# Patient Record
Sex: Male | Born: 1965 | Race: Black or African American | Hispanic: No | Smoking: Never smoker
Health system: Southern US, Community
[De-identification: ages and names within clinical notes are randomized; demographics above are authoritative.]

## PROBLEM LIST (undated history)

## (undated) ENCOUNTER — Encounter

## (undated) ENCOUNTER — Telehealth

## (undated) ENCOUNTER — Ambulatory Visit

## (undated) ENCOUNTER — Encounter: Attending: Gastroenterology | Primary: Gastroenterology

## (undated) ENCOUNTER — Ambulatory Visit: Payer: PRIVATE HEALTH INSURANCE | Attending: Gastroenterology | Primary: Gastroenterology

## (undated) ENCOUNTER — Ambulatory Visit: Payer: PRIVATE HEALTH INSURANCE

## (undated) ENCOUNTER — Telehealth: Attending: Gastroenterology | Primary: Gastroenterology

## (undated) ENCOUNTER — Encounter: Attending: Pharmacist | Primary: Pharmacist

## (undated) DIAGNOSIS — M109 Gout, unspecified: Secondary | ICD-10-CM

## (undated) DIAGNOSIS — K7460 Unspecified cirrhosis of liver: Secondary | ICD-10-CM

## (undated) DIAGNOSIS — I10 Essential (primary) hypertension: Secondary | ICD-10-CM

## (undated) DIAGNOSIS — K8309 Other cholangitis: Secondary | ICD-10-CM

## (undated) HISTORY — DX: Gout, unspecified: M10.9

## (undated) HISTORY — DX: Unspecified cirrhosis of liver: K74.60

## (undated) HISTORY — DX: Other cholangitis: K83.09

## (undated) HISTORY — DX: Essential (primary) hypertension: I10

## (undated) HISTORY — PX: TOTAL COLECTOMY: SHX852

---

## 1998-11-07 DIAGNOSIS — K7460 Unspecified cirrhosis of liver: Secondary | ICD-10-CM

## 1998-11-07 HISTORY — DX: Unspecified cirrhosis of liver: K74.60

## 1998-12-18 ENCOUNTER — Ambulatory Visit (HOSPITAL_COMMUNITY): Admission: RE | Admit: 1998-12-18 | Discharge: 1998-12-18 | Payer: Self-pay | Admitting: Gastroenterology

## 1999-12-22 ENCOUNTER — Encounter: Payer: Self-pay | Admitting: Emergency Medicine

## 1999-12-22 ENCOUNTER — Emergency Department (HOSPITAL_COMMUNITY): Admission: EM | Admit: 1999-12-22 | Discharge: 1999-12-22 | Payer: Self-pay | Admitting: Emergency Medicine

## 1999-12-23 ENCOUNTER — Ambulatory Visit (HOSPITAL_COMMUNITY): Admission: RE | Admit: 1999-12-23 | Discharge: 1999-12-23 | Payer: Self-pay | Admitting: Gastroenterology

## 1999-12-23 ENCOUNTER — Encounter: Payer: Self-pay | Admitting: Gastroenterology

## 1999-12-30 ENCOUNTER — Encounter: Payer: Self-pay | Admitting: Gastroenterology

## 1999-12-30 ENCOUNTER — Ambulatory Visit (HOSPITAL_COMMUNITY): Admission: RE | Admit: 1999-12-30 | Discharge: 1999-12-30 | Payer: Self-pay | Admitting: Gastroenterology

## 2000-01-01 ENCOUNTER — Encounter (INDEPENDENT_AMBULATORY_CARE_PROVIDER_SITE_OTHER): Payer: Self-pay | Admitting: Specialist

## 2000-01-01 ENCOUNTER — Encounter: Payer: Self-pay | Admitting: General Surgery

## 2000-01-02 ENCOUNTER — Inpatient Hospital Stay (HOSPITAL_COMMUNITY): Admission: AD | Admit: 2000-01-02 | Discharge: 2000-01-11 | Payer: Self-pay | Admitting: General Surgery

## 2000-01-12 ENCOUNTER — Inpatient Hospital Stay (HOSPITAL_COMMUNITY): Admission: EM | Admit: 2000-01-12 | Discharge: 2000-01-19 | Payer: Self-pay | Admitting: *Deleted

## 2000-01-12 ENCOUNTER — Encounter: Payer: Self-pay | Admitting: *Deleted

## 2000-01-17 ENCOUNTER — Encounter: Payer: Self-pay | Admitting: Surgery

## 2000-01-31 ENCOUNTER — Encounter: Payer: Self-pay | Admitting: General Surgery

## 2000-01-31 ENCOUNTER — Emergency Department (HOSPITAL_COMMUNITY): Admission: EM | Admit: 2000-01-31 | Discharge: 2000-01-31 | Payer: Self-pay | Admitting: Emergency Medicine

## 2000-02-14 ENCOUNTER — Encounter: Admission: RE | Admit: 2000-02-14 | Discharge: 2000-02-14 | Payer: Self-pay | Admitting: Gastroenterology

## 2000-02-14 ENCOUNTER — Encounter: Payer: Self-pay | Admitting: Gastroenterology

## 2000-09-11 ENCOUNTER — Encounter (INDEPENDENT_AMBULATORY_CARE_PROVIDER_SITE_OTHER): Payer: Self-pay

## 2000-09-11 ENCOUNTER — Ambulatory Visit (HOSPITAL_COMMUNITY): Admission: RE | Admit: 2000-09-11 | Discharge: 2000-09-11 | Payer: Self-pay | Admitting: Gastroenterology

## 2002-03-25 ENCOUNTER — Ambulatory Visit (HOSPITAL_COMMUNITY): Admission: RE | Admit: 2002-03-25 | Discharge: 2002-03-25 | Payer: Self-pay | Admitting: Gastroenterology

## 2002-03-25 ENCOUNTER — Encounter (INDEPENDENT_AMBULATORY_CARE_PROVIDER_SITE_OTHER): Payer: Self-pay | Admitting: *Deleted

## 2004-11-07 HISTORY — PX: LIVER TRANSPLANT: SHX410

## 2005-09-22 ENCOUNTER — Encounter: Admission: RE | Admit: 2005-09-22 | Discharge: 2005-09-22 | Payer: Self-pay | Admitting: Neurology

## 2007-12-04 ENCOUNTER — Ambulatory Visit: Payer: Self-pay | Admitting: Family Medicine

## 2007-12-04 ENCOUNTER — Ambulatory Visit (HOSPITAL_COMMUNITY): Admission: RE | Admit: 2007-12-04 | Discharge: 2007-12-04 | Payer: Self-pay | Admitting: Family Medicine

## 2007-12-04 DIAGNOSIS — I10 Essential (primary) hypertension: Secondary | ICD-10-CM | POA: Insufficient documentation

## 2007-12-04 DIAGNOSIS — Z944 Liver transplant status: Secondary | ICD-10-CM | POA: Insufficient documentation

## 2007-12-04 DIAGNOSIS — K5190 Ulcerative colitis, unspecified, without complications: Secondary | ICD-10-CM | POA: Insufficient documentation

## 2007-12-12 ENCOUNTER — Ambulatory Visit: Payer: Self-pay | Admitting: Family Medicine

## 2007-12-12 ENCOUNTER — Encounter: Payer: Self-pay | Admitting: Family Medicine

## 2007-12-12 LAB — CONVERTED CEMR LAB: Microalbumin U total vol: NEGATIVE mg/L

## 2007-12-13 ENCOUNTER — Encounter: Payer: Self-pay | Admitting: Family Medicine

## 2007-12-13 LAB — CONVERTED CEMR LAB
BUN: 16 mg/dL (ref 6–23)
CO2: 28 meq/L (ref 19–32)
Calcium: 9.6 mg/dL (ref 8.4–10.5)
Chloride: 100 meq/L (ref 96–112)
Cholesterol: 140 mg/dL (ref 0–200)
Creatinine, Ser: 1.08 mg/dL (ref 0.40–1.50)
Glucose, Bld: 118 mg/dL — ABNORMAL HIGH (ref 70–99)
HDL: 34 mg/dL — ABNORMAL LOW (ref >39)
LDL Cholesterol: 78 mg/dL (ref 0–99)
Potassium: 3.7 meq/L (ref 3.5–5.3)
Sodium: 139 meq/L (ref 135–145)
Total CHOL/HDL Ratio: 4.1
Triglycerides: 139 mg/dL (ref <150)
VLDL: 28 mg/dL (ref 0–40)

## 2008-02-07 ENCOUNTER — Ambulatory Visit: Payer: Self-pay | Admitting: Family Medicine

## 2008-02-07 DIAGNOSIS — E669 Obesity, unspecified: Secondary | ICD-10-CM | POA: Insufficient documentation

## 2008-02-21 ENCOUNTER — Encounter: Payer: Self-pay | Admitting: Family Medicine

## 2008-02-21 ENCOUNTER — Ambulatory Visit: Payer: Self-pay | Admitting: Family Medicine

## 2008-02-28 LAB — CONVERTED CEMR LAB: Glucose, Bld: 99 mg/dL (ref 70–99)

## 2008-04-16 ENCOUNTER — Ambulatory Visit: Payer: Self-pay | Admitting: Family Medicine

## 2008-04-16 DIAGNOSIS — R7309 Other abnormal glucose: Secondary | ICD-10-CM

## 2008-06-09 ENCOUNTER — Ambulatory Visit: Payer: Self-pay | Admitting: Sports Medicine

## 2008-06-10 ENCOUNTER — Ambulatory Visit: Payer: Self-pay | Admitting: Family Medicine

## 2008-06-10 ENCOUNTER — Encounter: Payer: Self-pay | Admitting: Family Medicine

## 2008-06-10 LAB — CONVERTED CEMR LAB
TSH: 1.632 microintl units/mL (ref 0.350–4.50)
Testosterone: 532.74 ng/dL (ref 350–890)

## 2008-06-11 ENCOUNTER — Encounter: Payer: Self-pay | Admitting: Family Medicine

## 2008-07-08 ENCOUNTER — Encounter: Payer: Self-pay | Admitting: Family Medicine

## 2008-08-19 ENCOUNTER — Telehealth: Payer: Self-pay | Admitting: *Deleted

## 2008-11-25 ENCOUNTER — Encounter: Payer: Self-pay | Admitting: Family Medicine

## 2009-01-12 ENCOUNTER — Ambulatory Visit: Payer: Self-pay | Admitting: Family Medicine

## 2009-01-27 ENCOUNTER — Ambulatory Visit: Payer: Self-pay | Admitting: Family Medicine

## 2009-01-27 ENCOUNTER — Encounter (INDEPENDENT_AMBULATORY_CARE_PROVIDER_SITE_OTHER): Payer: Self-pay | Admitting: *Deleted

## 2009-06-24 ENCOUNTER — Telehealth: Payer: Self-pay | Admitting: *Deleted

## 2009-12-08 ENCOUNTER — Encounter: Payer: Self-pay | Admitting: Family Medicine

## 2010-01-25 ENCOUNTER — Encounter: Payer: Self-pay | Admitting: Family Medicine

## 2010-05-14 ENCOUNTER — Encounter: Payer: Self-pay | Admitting: Family Medicine

## 2010-05-14 ENCOUNTER — Ambulatory Visit: Payer: Self-pay | Admitting: Family Medicine

## 2010-05-14 LAB — CONVERTED CEMR LAB
BUN: 16 mg/dL (ref 6–23)
CO2: 29 meq/L (ref 19–32)
Calcium: 9.3 mg/dL (ref 8.4–10.5)
Chloride: 99 meq/L (ref 96–112)
Cholesterol: 161 mg/dL (ref 0–200)
Creatinine, Ser: 1.02 mg/dL (ref 0.40–1.50)
Glucose, Bld: 117 mg/dL — ABNORMAL HIGH (ref 70–99)
HDL: 37 mg/dL — ABNORMAL LOW (ref >39)
LDL Cholesterol: 93 mg/dL (ref 0–99)
Potassium: 3.6 meq/L (ref 3.5–5.3)
Sodium: 140 meq/L (ref 135–145)
Total CHOL/HDL Ratio: 4.4
Triglycerides: 153 mg/dL — ABNORMAL HIGH (ref <150)
VLDL: 31 mg/dL (ref 0–40)

## 2010-05-17 ENCOUNTER — Encounter: Payer: Self-pay | Admitting: Family Medicine

## 2010-08-06 ENCOUNTER — Telehealth: Payer: Self-pay | Admitting: *Deleted

## 2010-08-24 ENCOUNTER — Ambulatory Visit: Payer: Self-pay | Admitting: Family Medicine

## 2010-08-24 DIAGNOSIS — J3489 Other specified disorders of nose and nasal sinuses: Secondary | ICD-10-CM | POA: Insufficient documentation

## 2010-10-21 ENCOUNTER — Ambulatory Visit: Payer: Self-pay | Admitting: Family Medicine

## 2010-11-07 DIAGNOSIS — M109 Gout, unspecified: Secondary | ICD-10-CM

## 2010-11-07 HISTORY — DX: Gout, unspecified: M10.9

## 2010-12-09 NOTE — Assessment & Plan Note (Signed)
Summary: bp ck,tcb   Vital Signs:  Patient profile:   45 year old male Height:      73.5 inches Weight:      269 pounds BMI:     35.14 BSA:     2.45 Temp:     98.0 degrees F Pulse rate:   57 / minute BP sitting:   134 / 91  Vitals Entered By: Jone Baseman CMA (May 14, 2010 8:56 AM) CC: BP check Is Patient Diabetic? No Pain Assessment Patient in pain? no        Primary Care Provider:  Myrtie Soman, MD  CC:  BP check.  History of Present Illness: 1. HTN:  Pt is taking and tolerating his medicines as prescribed.  He does check his blood pressure at home regularly.  He is normally not having any problems from his blood pressure.  However, 2 weeks ago he was dizzy so he checked his blood pressure and it was around 140/104 and the next day it was 134/101 and then the next day it normalized and was 130/83.      ROS: denies chest pain, shortness of breath, headache  2. Right shoulder pain:  Been there for 3-4 weeks.  Worse when he is lifting weights (primarily shoulder press and bench press).  Overall it is getting better.  Rest makes it better.  He hasn't been taking anything for it.  3. Fam Hx of DM:  He has never been checked for DM.  His mom and his sister have both been diagnosed with it.      ROS: denies polyuria, polydipsia, fatigue, vision changes  4. Obesity:  He likes to lift weight 3-4 times/ week.  He doesn't watch what he eats.  He has been gaining weight.  He is interested in losing some weight.  5. Ulcerative Colitis:  He is taking his medicines as prescribed.    Habits & Providers  Alcohol-Tobacco-Diet     Tobacco Status: never  Current Medications (verified): 1)  Prograf 1 Mg  Caps (Tacrolimus) .... 7 Caps By Mouth Tid 2)  Cellcept 500 Mg  Tabs (Mycophenolate Mofetil) .... 2 Tabs By Mouth Bid 3)  Asacol 400 Mg  Tbec (Mesalamine) .... 4 Tabs By Mouth Bid 4)  Ursodiol 300 Mg  Caps (Ursodiol) .... One By Mouth Tid 5)  Hydrochlorothiazide 25 Mg  Tabs  (Hydrochlorothiazide) .... One By Mouth Daily 6)  Metoprolol Tartrate 50 Mg Tabs (Metoprolol Tartrate) .... One By Mouth Bid 7)  Amlodipine Besylate 10 Mg Tabs (Amlodipine Besylate) .... One By Mouth Daily  Past History:  Past Medical History: Reviewed history from 02/09/2010 and no changes required. 1. NAFLD - dx'd in 1996, s/p Liver transplant in 2000 -- managed yearly [facility] (Dr. Jacqualine Mau) 2. open cholecystectomy in 2000 with mesh repair later for high hernia at surgical site 3. Ulcerative colitis -- managed by Dr. Matthias Hughs - colonoscopy 1/10 showed remission - follow-up in one year.  4. h/o colon polyps 5. meniscus repair R knee 1996 6. reports h/o of colon polyps - followed by Dr. Matthias Hughs     *colonoscpy 3/11 with removal of 2 sessile polyps; pathology negative for malignancy -- has follow-up colonoscpy recommended in one year.   Social History: Reviewed history from 06/09/2008 and no changes required. Divorced 2 years ago, remarried to [place] Landrum-Moe 9/08. Lives with wife and 3 children. Problems settling on house with ex-wife. Never smoked, etoh only in college. Works as Production designer, theatre/television/film for [employer].  Bachelor's from [employer] in sports management. 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  Physical Exam  General:  Vitals reviewed.  alert, well-hydrated, and overweight-appearing.   Eyes:  EOMI. Perrla. Funduscopic exam benign, without hemorrhages, exudates or papilledema. Vision grossly normal. Mouth:  Oral mucosa and oropharynx without lesions or exudates.  Teeth in good repair. Neck:  No deformities, masses, or tenderness noted. Lungs:  Normal respiratory effort, chest expands symmetrically. Lungs are clear to auscultation, no crackles or wheezes. Heart:  Normal rate and regular rhythm. S1 and S2 normal without gallop, murmur, click, rub or other extra sounds. Abdomen:  surgical scars stable, well-healed.  Msk:  R shoulder:  No deformity, swelling, or redness.  Normal ROM.  5/5  strength. Extremities:  2+ radial pulses; extremeties warm and well-perfused; no edema. Psych:  Oriented X3, normally interactive, and good eye contact.     Impression & Recommendations:  Problem # 1:  ESSENTIAL HYPERTENSION, BENIGN (ICD-401.1) Assessment Unchanged BP acceptable today.  Plan discussed with pt will be to have him monitor his BP over the next couple of months and if it remains elevated we will make some medication adjustments.  Check BMET today. The following medications were removed from the medication list:    Hydrochlorothiazide 25 Mg Tabs (Hydrochlorothiazide) ..... One tablet by mouth daily His updated medication list for this problem includes:    Hydrochlorothiazide 25 Mg Tabs (Hydrochlorothiazide) ..... One by mouth daily    Metoprolol Tartrate 50 Mg Tabs (Metoprolol tartrate) ..... One by mouth bid    Amlodipine Besylate 10 Mg Tabs (Amlodipine besylate) ..... One by mouth daily  Orders: T-Basic Metabolic Panel [phone]) [facility]- Est  Level 4 (10272)  Problem # 2:  SHOULDER PAIN, RIGHT (ICD-719.41) Assessment: New  Likely muscle strain from lifting.  Getting better.  Tylenol / Ibuprofen as needed.  Orders: [facility]- Est  Level 4 (53664)  Problem # 3:  PREDIABETES (ICD-790.29) Assessment: Unchanged  Recheck fasting blood sugar today  Orders: [facility]- Est  Level 4 (40347)  Problem # 4:  OBESITY (ICD-278.00) Assessment: New  Encouraged him to decrease his rest time between sets when he is lifting.  Will discuss diet with him more at next visit.  Check FLP today.  Orders: [facility]- Est  Level 4 (99214)  Complete Medication List: 1)  Prograf 1 Mg Caps (Tacrolimus) .... 7 caps by mouth tid 2)  Cellcept 500 Mg Tabs (Mycophenolate mofetil) .... 2 tabs by mouth bid 3)  Asacol 400 Mg Tbec (Mesalamine) .... 4 tabs by mouth bid 4)  Ursodiol 300 Mg Caps (Ursodiol) .... One by mouth tid 5)  Hydrochlorothiazide 25 Mg Tabs (Hydrochlorothiazide) .... One by mouth daily 6)   Metoprolol Tartrate 50 Mg Tabs (Metoprolol tartrate) .... One by mouth bid 7)  Amlodipine Besylate 10 Mg Tabs (Amlodipine besylate) .... One by mouth daily  Other Orders: T-Lipid Profile [phone])  Patient Instructions: 1)  It was a pleasure meeting you today 2)  I am going to check some blood word today including your cholesterol and to check you for diabetes.  I will let you know of those results 3)  For your blood pressure I would like for you to keep track of it for the next couple of months.  If it remains elevated please let me know 4)  For your workouts, try and decrease your rest time between sets that way you can get some cardio in with your weight training 5)  Please schedule a follow up appointment with me in  2-3 months  Prevention & Chronic Care Immunizations   Influenza vaccine: Not documented    Tetanus booster: Not documented    Pneumococcal vaccine: Not documented  Other Screening   Smoking status: never  (05/14/2010)  Lipids   Total Cholesterol: 140  (12/12/2007)   Lipid panel action/deferral: Lipid Panel ordered   LDL: 78  (12/12/2007)   LDL Direct: Not documented   HDL: 34  (12/12/2007)   Triglycerides: 139  (12/12/2007)  Hypertension   Last Blood Pressure: 134 / 91  (05/14/2010)   Serum creatinine: 1.08  (12/12/2007)   BMP action: Ordered   Serum potassium 3.7  (12/12/2007)    Hypertension flowsheet reviewed?: Yes   Progress toward BP goal: Unchanged  Self-Management Support :   Personal Goals (by the next clinic visit) :      Personal blood pressure goal: 140/90  (05/14/2010)   Hypertension self-management support: Not documented

## 2010-12-09 NOTE — Letter (Signed)
Summary: Generic Letter  [facility]  [address]   [place], [place] [address]   Phone: [phone]  Fax: [phone]    05/17/2010  [facility] [address] [place], [place]  [address]  Dear Mr. Plaskett,  Here is a copy of your lab results.  These results indicate that you do not have diabetes and your cholesterol is fine.  Tests: (1) Basic Metabolic Panel (57846)   Order Note: FASTING   Sodium                    140 mEq/L                   135-145   Potassium                 3.6 mEq/L                   3.5-5.3   Chloride                  99 mEq/L                    96-112   CO2                       29 mEq/L                    19-32   Glucose              [H]  117 mg/dL                   96-29   BUN                       16 mg/dL                    5-28   Creatinine                1.02 mg/dL                  0.40-1.50   Calcium                   9.3 mg/dL                   4.1-32.4  Tests: (2) Lipid Profile (40102)   Cholesterol               161 mg/dL                   7-253     ATP III Classification:           < 200        mg/dL        Desirable          200 - 239     mg/dL        Borderline High          >= 240        mg/dL        High         Triglyceride         [H]  153 mg/dL                   <664   HDL Cholesterol      [  L]  37 mg/dL                    >16   Total Chol/HDL Ratio      4.4 Ratio  VLDL Cholesterol (Calc)                             31 mg/dL                    1-09  LDL Cholesterol (Calc)                             93 mg/dL                    6-04           Total Cholesterol/HDL Ratio:CHD Risk                            Coronary Heart Disease Risk Table                                            Men       Women              1/2 Average Risk              3.4        3.3                  Average Risk              5.0        4.4              2 X Average Risk              9.6        7.1              3 X Average Risk              23.4       11.0     Use the calculated Patient Ratio above and the CHD Risk table      to determine the patient's CHD Risk.     ATP III Classification (LDL):           < 100        mg/dL         Optimal          100 - 129     mg/dL         Near or Above Optimal          130 - 159     mg/dL         Borderline High          160 - 189     mg/dL         High           > 190        mg/dL         Very High  Sincerely,   Angelena Sole MD  Appended Document: Generic Letter mailed

## 2010-12-09 NOTE — Consult Note (Signed)
Summary: [facility]   Imported By: Clydell Hakim 12/21/2009 16:15:38  _____________________________________________________________________  External Attachment:    Type:   Image     Comment:   External Document

## 2010-12-09 NOTE — Assessment & Plan Note (Signed)
Summary: F/U  BP/KH   Vital Signs:  Patient profile:   45 year old male Weight:      272.3 pounds Pulse rate:   60 / minute BP sitting:   135 / 90  (right arm) Cuff size:   large  Vitals Entered By: Arlyss Repress CMA, (October 21, 2010 2:55 PM) CC: f/up HTN and change of meds Is Patient Diabetic? No Pain Assessment Patient in pain? no        Primary Care Michalina Calbert:  Myrtie Soman, MD  CC:  f/up HTN and change of meds.  History of Present Illness: 1. HTN:  Pt is here to follow up on his blood pressure.  He is taking his medicines as prescribed.  He does check his blood pressure at home.  It is averaging around 135/90.  He was having some light-headedness for a couple weeks after we made the change but for the past 3 weeks he hasn't had that any more.  ROS:  Denies chest pain, shortness of breath   **Pt would like to schedule a full physical with blood work for next visit**  Habits & Providers  Alcohol-Tobacco-Diet     Tobacco Status: never  Current Medications (verified): 1)  Prograf 1 Mg  Caps (Tacrolimus) .... 7 Caps By Mouth Tid 2)  Cellcept 500 Mg  Tabs (Mycophenolate Mofetil) .... 2 Tabs By Mouth Bid 3)  Asacol 400 Mg  Tbec (Mesalamine) .... 4 Tabs By Mouth Bid 4)  Ursodiol 300 Mg  Caps (Ursodiol) .... One By Mouth Tid 5)  Hydrochlorothiazide 50 Mg Tabs (Hydrochlorothiazide) .Marland Kitchen.. 1 Tab By Mouth Daily 6)  Metoprolol Tartrate 50 Mg Tabs (Metoprolol Tartrate) .... One By Mouth Bid 7)  Amlodipine Besylate 10 Mg Tabs (Amlodipine Besylate) .... One By Mouth Daily 8)  Fluticasone Propionate 50 Mcg/act Susp (Fluticasone Propionate) .Marland Kitchen.. 1 Spray Into Right Nostril Twice A Day  Social History: Reviewed history from 05/14/2010 and no changes required. Divorced 2 years ago, remarried to [place] Landrum-Thibeau 9/08. Lives with wife and 3 children.  Works as Production designer, theatre/television/film for [employer]. Bachelor's from [employer] in sports management. 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  Physical Exam  General:  Vitals reviewed.  alert, well-hydrated, and overweight-appearing.   Eyes:  EOMI. Perrla. Funduscopic exam benign Neck:  no masses.   Lungs:  Normal respiratory effort, chest expands symmetrically. Lungs are clear to auscultation, no crackles or wheezes. Heart:  Normal rate and regular rhythm. S1 and S2 normal without gallop, murmur, click, rub or other extra sounds. Extremities:  2+ radial pulses; extremeties warm and well-perfused; no edema.   Impression & Recommendations:  Problem # 1:  ESSENTIAL HYPERTENSION, BENIGN (ICD-401.1) Assessment Improved  Doing well.  At goal.  Continue current medications. His updated medication list for this problem includes:    Hydrochlorothiazide 50 Mg Tabs (Hydrochlorothiazide) .Marland Kitchen... 1 tab by mouth daily    Metoprolol Tartrate 50 Mg Tabs (Metoprolol tartrate) ..... One by mouth bid    Amlodipine Besylate 10 Mg Tabs (Amlodipine besylate) ..... One by mouth daily  Orders: [facility]- Est Level  3 ([address])  Complete Medication List: 1)  Prograf 1 Mg Caps (Tacrolimus) .... 7 caps by mouth tid 2)  Cellcept 500 Mg Tabs (Mycophenolate mofetil) .... 2 tabs by mouth bid 3)  Asacol 400 Mg Tbec (Mesalamine) .... 4 tabs by mouth bid 4)  Ursodiol 300 Mg Caps (Ursodiol) .... One by mouth tid 5)  Hydrochlorothiazide 50 Mg Tabs (Hydrochlorothiazide) .Marland Kitchen.. 1 tab by mouth  daily 6)  Metoprolol Tartrate 50 Mg Tabs (Metoprolol tartrate) .... One by mouth bid 7)  Amlodipine Besylate 10 Mg Tabs (Amlodipine besylate) .... One by mouth daily 8)  Fluticasone Propionate 50 Mcg/act Susp (Fluticasone propionate) .Marland Kitchen.. 1 spray into right nostril twice a day  Patient Instructions: 1)  I am pleased with your blood pressure 2)  We will keep you with your same medicines for now 3)  Please schedule an appointment in 3 months and we'll do a full physical then Prescriptions: AMLODIPINE BESYLATE 10 MG TABS (AMLODIPINE BESYLATE) one by mouth daily  #90 x 6    Entered and Authorized by:   Angelena Sole MD   Signed by:   Angelena Sole MD on 10/21/2010   Method used:   Electronically to        [facility]  [phone]* (retail)       [address]       [place], [place]  [address]       Ph: [phone] or [phone]       Fax: [phone]   RxID:   [id number]    Orders Added: 1)  [facility]- Est Level  3 [[address]]

## 2010-12-09 NOTE — Assessment & Plan Note (Signed)
Summary: bp recheck/bmc   Vital Signs:  Patient profile:   45 year old male Height:      73.5 inches Weight:      272.5 pounds BMI:     35.59 Temp:     97.8 degrees F oral Pulse rate:   69 / minute BP sitting:   147 / 85  (left arm) Cuff size:   large  Vitals Entered By: Jimmy Footman, CMA (August 24, 2010 2:00 PM) CC: bp recheck, med reflls Is Patient Diabetic? No Pain Assessment Patient in pain? no        Primary Care Provider:  Myrtie Soman, MD  CC:  bp recheck and med reflls.  History of Present Illness: 1. HTN: Pt is taking and tolerating his medicines as prescribed.  He doesn't check his BP at home regularly.  When he does it is usually 145/85.    ROS: denies chest pain, shortness of breath  2. Sinus congestion:  He has had some difficulty breathing out of his right nostril for the past month.  He doesn't have any known seasonal allergies and hasn't had this problem before.  He can still move air okay but it seems more difficult than the left nostril.  ROS: denies fevers, sinus pain, cough, runny nose, itchy eyes  3. Liver transplant:  Only followed once a year by hepatologist.  His immune suppression drugs are working well.   Habits & Providers  Alcohol-Tobacco-Diet     Tobacco Status: never  Current Medications (verified): 1)  Prograf 1 Mg  Caps (Tacrolimus) .... 7 Caps By Mouth Tid 2)  Cellcept 500 Mg  Tabs (Mycophenolate Mofetil) .... 2 Tabs By Mouth Bid 3)  Asacol 400 Mg  Tbec (Mesalamine) .... 4 Tabs By Mouth Bid 4)  Ursodiol 300 Mg  Caps (Ursodiol) .... One By Mouth Tid 5)  Hydrochlorothiazide 50 Mg Tabs (Hydrochlorothiazide) .Marland Kitchen.. 1 Tab By Mouth Daily 6)  Metoprolol Tartrate 50 Mg Tabs (Metoprolol Tartrate) .... One By Mouth Bid 7)  Amlodipine Besylate 10 Mg Tabs (Amlodipine Besylate) .... One By Mouth Daily 8)  Fluticasone Propionate 50 Mcg/act Susp (Fluticasone Propionate) .Marland Kitchen.. 1 Spray Into Right Nostril Twice A Day  Past History:  Past Medical  History: Reviewed history from 02/09/2010 and no changes required. 1. NAFLD - dx'd in 1996, s/p Liver transplant in 2000 -- managed yearly at [facility] (Dr. Jacqualine Mau) 2. open cholecystectomy in 2000 with mesh repair later for high hernia at surgical site 3. Ulcerative colitis -- managed by Dr. Matthias Hughs - colonoscopy 1/10 showed remission - follow-up in one year.  4. h/o colon polyps 5. meniscus repair R knee 1996 6. reports h/o of colon polyps - followed by Dr. Matthias Hughs     *colonoscpy 3/11 with removal of 2 sessile polyps; pathology negative for malignancy -- has follow-up colonoscpy recommended in one year.   Physical Exam  General:  Vitals reviewed.  alert, well-hydrated, and overweight-appearing.   Nose:  Right nare:  Nasal airway is slightly red and swollen.  No polyps.  No nasal discharge Left nare: normal. Mouth:  Oral mucosa and oropharynx without lesions or exudates.  Teeth in good repair. Neck:  No deformities, masses, or tenderness noted. Lungs:  Normal respiratory effort, chest expands symmetrically. Lungs are clear to auscultation, no crackles or wheezes. Heart:  Normal rate and regular rhythm. S1 and S2 normal without gallop, murmur, click, rub or other extra sounds. Abdomen:  surgical scars stable, well-healed.   S/NT/ND Extremities:  2+ radial pulses;  extremeties warm and well-perfused; no edema.   Impression & Recommendations:  Problem # 1:  ESSENTIAL HYPERTENSION, BENIGN (ICD-401.1) Assessment Unchanged  Not at goal.  Will increase HCTZ to 50 mg daily.  F/U in 6-8 weeks to recheck. His updated medication list for this problem includes:    Hydrochlorothiazide 50 Mg Tabs (Hydrochlorothiazide) .Marland Kitchen... 1 tab by mouth daily    Metoprolol Tartrate 50 Mg Tabs (Metoprolol tartrate) ..... One by mouth bid    Amlodipine Besylate 10 Mg Tabs (Amlodipine besylate) ..... One by mouth daily  Orders: [facility]- Est  Level 4 (99214)  Problem # 2:  OTHER DISEASES OF NASAL CAVITY AND SINUSES  (ICD-478.19) Assessment: New  Right nasal passage is inflammed.  ? allergies.  Will try flonase to see if that helps.  Orders: [facility]- Est  Level 4 (99214)  Problem # 3:  LIVER REPLACED BY TRANSPLANT (ICD-V42.7) Assessment: Unchanged  Doing well.  Routine follow up.  Orders: [facility]- Est  Level 4 (99214)  Complete Medication List: 1)  Prograf 1 Mg Caps (Tacrolimus) .... 7 caps by mouth tid 2)  Cellcept 500 Mg Tabs (Mycophenolate mofetil) .... 2 tabs by mouth bid 3)  Asacol 400 Mg Tbec (Mesalamine) .... 4 tabs by mouth bid 4)  Ursodiol 300 Mg Caps (Ursodiol) .... One by mouth tid 5)  Hydrochlorothiazide 50 Mg Tabs (Hydrochlorothiazide) .Marland Kitchen.. 1 tab by mouth daily 6)  Metoprolol Tartrate 50 Mg Tabs (Metoprolol tartrate) .... One by mouth bid 7)  Amlodipine Besylate 10 Mg Tabs (Amlodipine besylate) .... One by mouth daily 8)  Fluticasone Propionate 50 Mcg/act Susp (Fluticasone propionate) .Marland Kitchen.. 1 spray into right nostril twice a day  Patient Instructions: 1)  It was good to see you again today 2)  We will increase the HCTZ to 50 mg daily 3)  I am also going to start Flonase for your sinus congestion 4)  Please schedule a follow up appointment in 6-8 weeks to recheck your blood pressure Prescriptions: HYDROCHLOROTHIAZIDE 50 MG TABS (HYDROCHLOROTHIAZIDE) 1 tab by mouth daily  #90 x 3   Entered and Authorized by:   Angelena Sole MD   Signed by:   Angelena Sole MD on 08/24/2010   Method used:   Electronically to        [facility]  [phone]* (retail)       [address]       [place], [place]  [address]       Ph: [phone] or [phone]       Fax: [phone]   RxID:   [id number] FLUTICASONE PROPIONATE 50 MCG/ACT SUSP (FLUTICASONE PROPIONATE) 1 spray into right nostril twice a day  #1 x 1   Entered and Authorized by:   Angelena Sole MD   Signed by:   Angelena Sole MD on 08/24/2010   Method used:   Electronically to        [facility]  [phone]* (retail)       [address]       [place], [place]  [address]       Ph: [phone] or [phone]       Fax: [phone]   RxID:   [id number] AMLODIPINE BESYLATE 10 MG TABS (AMLODIPINE BESYLATE) one by mouth daily  #90 x 3   Entered and Authorized by:   Angelena Sole MD   Signed by:   Angelena Sole MD on 08/24/2010  Method used:   Electronically to        [facility]  [phone]* (retail)       [address]       [place], [place]  [address]       Ph: [phone] or [phone]       Fax: [phone]   RxID:   [id number] METOPROLOL TARTRATE 50 MG TABS (METOPROLOL TARTRATE) one by mouth bid  #180 x 3   Entered and Authorized by:   Angelena Sole MD   Signed by:   Angelena Sole MD on 08/24/2010   Method used:   Electronically to        [facility]  [phone]* (retail)       [address]       [place], [place]  [address]       Ph: [phone] or [phone]       Fax: [phone]   RxID:   [id number]    Orders Added: 1)  [facility]- Est  Level 4 [[address]]    Prevention & Chronic Care Immunizations   Influenza vaccine: Not documented    Tetanus booster: Not documented    Pneumococcal vaccine: Not documented  Other Screening   Smoking status: never  (08/24/2010)  Lipids   Total Cholesterol: 161  (05/14/2010)   Lipid panel action/deferral: Lipid Panel ordered   LDL: 93  (05/14/2010)   LDL Direct: Not documented   HDL: 37  (05/14/2010)   Triglycerides: 153  (05/14/2010)  Hypertension   Last Blood Pressure: 147 / 85  (08/24/2010)   Serum creatinine: 1.02  (05/14/2010)   BMP action: Ordered   Serum potassium 3.6  (05/14/2010)    Hypertension flowsheet reviewed?: Yes   Progress toward BP goal: Unchanged  Self-Management Support :   Personal Goals (by the next clinic visit) :      Personal blood pressure goal: 140/90   (05/14/2010)   Hypertension self-management support: Not documented

## 2010-12-09 NOTE — Consult Note (Signed)
[facility]  [facility]   Imported By: Bradly Bienenstock 02/09/2010 09:29:19  _____________________________________________________________________  External Attachment:    Type:   Image     Comment:   External Document

## 2010-12-09 NOTE — Progress Notes (Signed)
Summary: Rx  Phone Note Refill Request Call back at [phone]   Refills Requested: Medication #1:  AMLODIPINE BESYLATE 10 MG TABS one by mouth daily.  Medication #2:  HYDROCHLOROTHIAZIDE 25 MG  TABS one by mouth daily Initial call taken by: Knox Royalty,  August 06, 2010 1:49 PM  Follow-up for Phone Call        Informed pt that refill was sent and that he would be given additional refills @ his next scheduled appt ............................................... Delora Fuel August 06, 2010 5:13 PM  Follow-up by: Jone Baseman CMA,  August 06, 2010 5:13 PM    Prescriptions: HYDROCHLOROTHIAZIDE 25 MG  TABS (HYDROCHLOROTHIAZIDE) one by mouth daily  #90 x 0   Entered by:   Jone Baseman CMA   Authorized by:   Angelena Sole MD   Signed by:   Jone Baseman CMA on 08/06/2010   Method used:   Electronically to        [facility]  [phone]* (retail)       [address]       [place], [place]  [address]       Ph: [phone] or [phone]       Fax: [phone]   RxID:   [id number] AMLODIPINE BESYLATE 10 MG TABS (AMLODIPINE BESYLATE) one by mouth daily  #90 x 0   Entered by:   Jone Baseman CMA   Authorized by:   Angelena Sole MD   Signed by:   Jone Baseman CMA on 08/06/2010   Method used:   Electronically to        [facility]  [phone]* (retail)       [address]       [place], [place]  [address]       Ph: [phone] or [phone]       Fax: [phone]   RxID:   [id number]

## 2011-03-25 NOTE — Discharge Summary (Signed)
[place]. [facility]  Patient:    Timothy Riggs, Timothy Riggs                       MRN: [id number] Adm. Date:  [id number] Disc. Date: [id number] Attending:  Osvaldo Human                           Discharge Summary  DISCHARGE DIAGNOSES:  1. Sclerosing cholangitis.  2. Acute cholangitis.  3. Postoperative wound infection.  OPERATION/PROCEDURE: None.  HISTORY OF PRESENT ILLNESS: This is a readmission for Mr. Timothy Riggs.  He is a 45 year old black male discharged just one day ago following open cholecystectomy on January 01, 2000.  The patient has a long-standing history of Crohns disease and sclerosing cholangitis, and presented with acute cholecystitis.  Open cholecystectomy was performed, and liver biopsy, and the clinical course postoperatively was consistent with smoldering acute cholangitis.  He was treated with antibiotics and had significantly improved. He also developed postoperative ascites that responded to diuretics.  The patient was feeling well at discharge, and then the morning of readmission developed progressive worsening of right upper quadrant abdominal pain and nausea and vomiting.  He presented to the [facility] Room. Currently at the time of his admission he is much more comfortable following pain medication.  PAST MEDICAL HISTORY: As above, otherwise negative.  MEDICATIONS:  1. Dipentum b.i.d.  2. Augmentin 500 mg b.i.d.  3. Lasix 20 mg q.d.  4. Aldactone 25 mg q.d.  5. K-Dur.  ALLERGIES: No known drug allergies.  PHYSICAL EXAMINATION:  VITAL SIGNS: Temperature was increased to 102 degrees.  Pulse was 130, respirations 20, and blood pressure 130/78.  GENERAL: He is a well-developed, well-nourished black male in no acute distress.  ABDOMEN: Pertinent findings were limited to the abdomen, which is mildly distended.  His incision appears to be healing but there is some fluid palpable beneath the lateral incision  without erythema.  There is mild tenderness in the right upper quadrant without guarding.  LABORATORY DATA: Laboratories on admission shows a WBC of 16,000 (was 10,000 yesterday at discharge), hemoglobin 10.5.  Bilirubin is increased to 8.3. Alkaline phosphatase is 584.  SGOT 212, SGPT 69.  CT scan of the abdomen was obtained and revealed a small to moderate fluid collection over the liver and in the porta hepatis, and in the pelvis, all consistent with ascites and normal postoperative changes.  There were questionably one or two low density areas in the periphery of the liver consistent with small abscesses.  HOSPITAL COURSE: The patient was admitted with right upper quadrant pain, fever, and elevated WBC, suspected to be low-grade acute cholangitis.  CT was really not felt to be consistent with a bile leak.  He was also felt to possibly have a wound infection.  The patient was admitted, blood cultures obtained, and IV antibiotics started.  He was immediately much more comfortable after admission.  His lateral wound was opened and drained, and his wound was treated with saline dressing changes.  The following day his WBC was decreased to 15,000, although his bilirubin was increased to 10.  Dr. Matthias Hughs followed the patient closely on a daily basis while hospitalized.  He did clinically improve and really had no recurrent of his abdominal pain.  On January 14, 2000 his WBC was decreased to 12,000 and bilirubin decreased to 9. He tolerated diet without difficulty.  He did end up growing a  gram-negative rod in his blood and was continued on IV Unasyn.  His wound was felt to likely be the source or possibly the biliary tree.  This subsequently proved to be a Klebsiella pneumoniae.  Clinically he improved and WBC on January 16, 2000 was decreased to 10,000 and bilirubin decreased to 6.  His CT was repeated, with plans to aspirate perihepatic fluid if still present; however, the CT showed marked  reduction in the fluid around the liver and less suspicion of small peripheral liver abscesses, and no tap was performed.  He was continued on IV antibiotics.  By January 18, 2000 his bilirubin was decreased to 5 and WBC was 10,000.  The wound cleaned up nicely with saline dressing changes.  He continued to steadily improve and was discharged on January 19, 2000.  DISCHARGE MEDICATIONS:  1. Augmentin p.o.  2. Continue the same medications as on admission.  FOLLOW-UP: Follow-up is to be with Dr. Matthias Hughs and myself in approximately one week. DD:  03/01/00 TD:  03/01/00 Job: [id number]

## 2011-03-25 NOTE — Op Note (Signed)
[facility]  Patient:    Timothy Riggs, Timothy Riggs                       MRN: [id number] Proc. Date: 09/11/00 Adm. Date:  [id number] Attending:  Rich Brave CC:         Brooke Dare, M.D., Div. of GI, [id number], [facility] , [place], [place] [address]                           Operative Report  PROCEDURE:  Colonoscopy with biopsy.  ENDOSCOPIST:  Florencia Reasons, M.D.  INDICATIONS:  A 45 year old male with longstanding ulcerative colitis and sclerosing cholangitis.  FINDINGS:  Minimal to mild colitis activity.  Multiple random mucosal biopsies obtained.  DESCRIPTION OF PROCEDURE:  The nature, purpose, and risks of the procedure were familiar to the patient from prior examinations, and he provided written consent.  Sedation was fentanyl 100 mcg and Versed 10 mg IV for this procedure and the upper endoscopy which had immediately preceded it.  Digital exam of the prostate was unremarkable.  The Olympus PCF140L pediatric extended length video colonoscope was easily advanced to the cecum, and pullback was then performed.  The quality of the prep was excellent, and it is felt that all areas were well seen.  There was some mild "sandpaper" erythema throughout essentially the entire length of the colon but without, for the most part, any significant disruption of the normal underlying vascular mucosal pattern nor any evidence of any granularity, exudate, or significant friability.  No polyps, masses, vascular malformations, or diverticular disease were observed.  Retroflexion was not performed in the rectum, but antegrade viewing disclosed no additional abnormalities.  Random mucosal biopsies were obtained roughly every 2 to 4 cm along the length of the colon, totalling approximately 30 biopsies in all.  The patient tolerated the procedure well, and there were no apparent complications.  IMPRESSION:  Minimal colitis activity.  PLAN:  Await results on  biopsies for dysplasia surveillance. DD:  09/11/00 TD:  09/11/00 Job: [id number]

## 2011-03-25 NOTE — Procedures (Signed)
[place]. [facility]  Patient:    Timothy, Riggs Visit Number: [id number] MRN: [id number]          Service Type: END Location: ENDO Attending Physician:  Rich Brave Dictated by:   Florencia Reasons, M.D. Proc. Date: 03/25/02 Admit Date:  03/25/2002   CC:         Dr. Brooke Dare, [id number], [facility] [address]   Procedure Report  PROCEDURE PERFORMED:  Upper endoscopy with esophageal banding.  ENDOSCOPIST:  Florencia Reasons, M.D.  INDICATIONS FOR PROCEDURE:  The patient is a 45 year old with sclerosing cholangitis and history of previous esophageal varices.  FINDINGS:  2 to 3+ varices, banded x 4.  DESCRIPTION OF PROCEDURE:  The nature, purpose and risks of the procedure had been discussed with the patient, who provided written consent.  Sedation was fentanyl 75 mcg and Versed 7 mg IV without arrhythmias or desaturation. The Olympus adult video endoscope was passed under direct vision entering the esophagus with mild difficulty, until the patient cooperated with swallowing.  The esophagus was pertinent for 2 to 3+ varices in two columns extending up to the midesophagus.  At the end of the procedure, the patient was re-endoscoped and these were banded with three bands on one varix and one band on the other. No reflux esophagitis, neoplasia or infection were evident.  The stomach contained a small bilious residual and had a little bit of proximal gastric erythema but no definite gastric varices.  No erosions, ulcers, polyps or masses were seen in the stomach and the pylorus, duodenal bulb and second duodenum looked normal.  No biopsies were obtained.  The patient tolerated the procedure well and there were no apparent complications.  IMPRESSION:  Esophageal varices, banded as described above.  PLAN:  Follow-up endoscopy in a couple of months to perform repeat banding if residual variceal tissue was present. Dictated by:    Florencia Reasons, M.D. Attending Physician:  Rich Brave DD:  03/25/02 TD:  03/27/02 Job: [id number]

## 2011-03-25 NOTE — Consult Note (Signed)
[place]. [facility]  Patient:    Timothy Riggs, Timothy Riggs                       MRN: [id number] Proc. Date: 01/06/00 Adm. Date:  [id number] Attending:  Delsa Bern CC:         Lorne Skeens. Hoxworth, M.D.                          Consultation Report  GASTROENTEROLOGY CONSULTATION  REASON FOR CONSULTATION:  Dr. Sharlet Salina T. Hoxworth asked me to see this 45 year old gentleman because of possible smoldering cholangitis.  HISTORY:  Timothy Riggs is about a week status post open cholecystectomy for acute cholecystitis.  Since his surgery, he has had smoldering fevers, leukocytosis and substantial elevation of liver chemistries beyond baseline, raising the question of cholangitis.  The patient, who has a long-standing history of ulcerative colitis, is also known to have sclerosing cholangitis.  An ERCP performed approximately four years ago, at which time multiple common duct stones were present, showed marked irregularity of the intrahepatic biliary tree.  The patient also had a couple of hospitalizations at [facility] several years ago because of fevers attributed to cholangitis, which responded to antibiotic therapy.  During this current operation, he underwent an intraoperative cholangiogram which showed attenuation of the common bile duct but no focal strictures, and there was adequate flow of contrast into the duodenum.  There was not much filling of the  intrahepatic biliary tree, so it is difficult to comment on the current status f his intrahepatic biliary strictures.  At this time, the patient has been switched from Rocephin to Unasyn as his antibiotic coverage and, for the past 24 to 36 hours, his fever curve has been normal.  He is free of any abdominal pain.  He is eating small amounts and is certainly in no acute distress.  Of further interest to the patients condition s the fact that a wedge liver biopsy obtained during the course of  his operation showed substantial hepatic fibrosis and pre-cirrhotic changes, per conservation  with the pathologist, Dr. Jimmy Picket.  He has also been noted to have some degree of abdominal distention, raising the  question of possible ascites accumulation.  There have been some mild loose stools since surgery but no frank ongoing diarrhea.  At surgery, the patient had an acutely inflamed gallbladder, consistent with acute cholecystitis, but more importantly, the liver was markedly enlarged and irregular in its contour.  PAST MEDICAL HISTORY:  Limited primarily to the ulcerative colitis, common duct  stones and history of sclerosing cholangitis.  FAMILY HISTORY:  Pertinent for the fact that his younger brother has ulcerative  colitis with sclerosing cholangitis also and, in fact, has been considered for referral to a liver transplant clinic.  SOCIAL HISTORY:  The patient is married but has no children.  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  REVIEW OF SYSTEMS:  Pertinent for a tendency for frequent loose bowel movements as an outpatient but without typically frank blood, significant abdominal pain or severe diarrhea.  PHYSICAL EXAMINATION:  GENERAL:  Timothy Riggs is a very stout, healthy-appearing African-American male in no evident distress.  HEENT:  Interestingly, his sclerae are not frankly icteric at this time.  ABDOMEN:  The abdomen is soft and without overt tenderness.  There is a large bandage over his laparoscopic incision (his surgery was started as a laparoscopic procedure) due to serous drainage  at that site.  He also has a JP drain, draining clear-amber fluid.  LABORATORY DATA:  White count has ranged from 24,000 at the time of admission to 20,000 today.  Hemoglobin is 9.8 with an MCV of 91.  Platelets have been moderately elevated in the 600,000 to 700,000 range and RDW is also high at 15.9.  The patients sodium has dropped from a baseline of 130  to a current level of 123 and his albumin, which was 1.7 on admission, is currently 1.4.  Total bilirubin in-house has climbed from 4.5 to 8.3 but it was actually a little bit higher yesterday than it is today.  During that same time, his alkaline phosphatase has dropped from a high of 347 to 298.  Transaminases have been mildly elevated, in the 100 range for SGOT and in the 50 range for SGPT.  X-RAY FINDINGS:  I reviewed the patients ERCP films from 1997 and his current intraoperative cholangiography, with findings as described above.  IMPRESSION: 1. Probable smoldering cholangitis characterized by fevers and elevated liver    chemistries. 2. 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 3. Probable mild ascites development. 4. Underlying ulcerative colitis.  DISCUSSION AND RECOMMENDATIONS: 1. With respect to the cholangitis, the patient has been afebrile while on Unasyn    and is free of any real pain. I suspect that his biliary tree may have been    seeded prior to surgery from gallbladder inflammation or infection or possibly    from the cholangiography performed at surgery.  In any event, if an infection    were present, it seems to be coming under control on the current therapy, even    though his white count remains somewhat elevated.  In reviewing the patients    previous endoscopic retrograde cholangiopancreatogram, I feel that the    irregularities in the intrahepatic biliary tree are so severe that it would e    difficult to treat any infection in the biliary tree endoscopically, such as by    stenting or balloon dilatation.  Any attempts to do so would probably be as    likely to introduce infection as to relieve it.  If it became necessary to    attempt such a maneuver, I would recommend referral to a biliary center of    excellence such as [facility]. The "easy" tests, such as draining the    common bile duct itself, appear to be  unnecessary, based on the finding on the    current cholangiogram, showing good drainage into the duodenum, despite the    generalized narrowing of the common bile duct.  2. With respect to the patients parenchymal liver disease, this could readily    account for his hyponatremia and his markedly low albumin level.  Per    conservation with Dr. Luisa Hart, it appears that the patient is currently grade 3    on a scale of 4 with the severity of his liver disease, and is, in fact,    pre-cirrhotic.  I therefore discussed with the patient the probable need for    elective evaluation at a liver transplant clinic.  Unfortunately, I am not    currently aware of any effective medical therapy for primary sclerosing    cholangitis but I will attempt to look into this.  Fortunately, he is not    showing signs of encephalopathy or other overt evidence of liver failure. 3. Concerning his apparent ascites, this is not too surprising, considering his  extensive hepatic fibrosis and his marked hypoalbuminemia.  I will request daily    weights to see how we are progressing here and if it is unclear, ultrasound ay    help clarify the picture.  The fluid would probably respond to diuretics, if it    progresses, but at the moment, I do not see a need for them. 4. Ulcerative colitis.  His underlying ulcerative colitis seems to be relatively    stable and quiescent at present.  He is having some loose stools which more    likely are arising from his antibiotic therapy and his post-cholecystectomy    status rather than to the ulcerative colitis itself.  I will restart mesalamine    (Asacol) but at the present time, I see no need for steroids.  I appreciate the opportunity to have seen this patient in consultation with you.DD:  01/06/00 TD:  01/07/00 Job: [id number]

## 2011-03-25 NOTE — Procedures (Signed)
[place]. [facility]  Patient:    DONNE, BALEY Visit Number: [id number] MRN: [id number]          Service Type: END Location: ENDO Attending Physician:  Rich Brave Dictated by:   Florencia Reasons, M.D. Proc. Date: 03/25/02 Admit Date:  03/25/2002   CC:         Dr. Brooke Dare, [id number], [facility] [address]   Procedure Report  PROCEDURE PERFORMED:  Colonoscopy with biopsies.  ENDOSCOPIST:  Florencia Reasons, M.D.  INDICATIONS FOR PROCEDURE:  The patient is a 46 year old with longstanding ulcerative colitis for dysplasia surveillance.  This last exam by me was approximately a year and a half ago at which time, no dysplasia was present and there was just minimally active chronic colitis at that time.  FINDINGS:  Low-grade "burned out" colitis with polypoid lesion in the ascending colon, biopsied and tattooed.  DESCRIPTION OF PROCEDURE:  The nature, purpose and risks of the procedure were familiar to the patient, who provided written consent.  Sedation for this procedure and the upper endoscopy which preceded it totaled fentanyl 75 mcg and Versed 8 mg IV without arrhythmias or desaturation.  Digital exam of the prostate was unremarkable. The Olympus colonoscope was advanced to the cecum as identified by the absence of further lumen and what appeared to be the ileocecal valve although the terminal ileum was not entered.  Pullback was then performed.  The quality of the prep was very good and it is felt that all areas were well seen.  In the midascending colon was a 1.5 cm verrucous somewhat irregularly marginated sessile lesion which I biopsied multiple times.  I elected not to attempt to snare it off, pending further delineation of its histological character.  No other polypoid lesions were observed in the colon and there was no evidence of any frank tumor or cancer, vascular malformations or diverticulosis but there was some  patchy erythema especially in the distal rectum and in the cecum.  No granularity, exudate or particularly active colitis appeared to be present. Note that the polypoid lesion was tattooed in three quadrants around it after it was biopsied.  Random mucosal biopsies were taken along the length of the colon and placed in three separate jars for the proximal colon, left colon and rectum.  Retroflexion was not performed in the rectum.  The patient tolerated the procedure well and there were no apparent complications.  IMPRESSION: 1. Polypoid lesion in the ascending colon of uncertain clinical significance,    pathology pending. 2. Low-grade burned out colitis, with random biopsies pending for dysplasia.  PLAN:  Await pathology results. Dictated by:   Florencia Reasons, M.D. Attending Physician:  Rich Brave DD:  03/25/02 TD:  03/27/02 Job: [id number]

## 2011-03-25 NOTE — Op Note (Signed)
[place]  Patient:    Timothy Riggs, Timothy Riggs                       MRN: [id number] Proc. Date: 01/01/00 Adm. Date:  [id number] Attending:  Delsa Bern CC:         Florencia Reasons, M.D.                           Operative Report  PREOPERATIVE DIAGNOSES: 1. Acute cholecystitis. 2. Sclerosing cholangitis.  POSTOPERATIVE DIAGNOSES: 1. Acute cholecystitis. 2. Sclerosing cholangitis.  SURGICAL PROCEDURE: 1. Laparoscopy. 2. Open cholecystectomy with intraoperative cholangiogram and wedge liver    biopsy.  SURGEON:  Lorne Skeens. Hoxworth, M.D.  ASSISTANTSheppard Plumber. Earlene Plater, M.D.  INDICATIONS:  Timothy Riggs is a 45 year old black male with a long history of Crohns disease and sclerosing cholangitis, followed by Dr. Katy Fitch. Buccini. e was feeling well as usual until about one week ago, when he developed persistent right upper quadrant and epigastric pain.  He has been noted to have an elevated white count, now up to 16,000, and his liver function tests have worsened with  bilirubin now up to 6.2, when he usually runs 2.  He has had a workup consisting of an ultrasound of the gallbladder which shows a thickened gallbladder wall, but o definite gallstones.  A HIDA scan was done yesterday which showed nonvisualization of the gallbladder, consistent with acute cholecystitis.  He does have a history of an ERCP and common bile duct stone with extraction several years ago.  He is felt to have acute cholecystitis and laparoscopic and possible open cholecystectomy ith cholangiogram has been recommended and accepted.  In addition, I have been requested to obtain a liver biopsy to follow his sclerosing cholangitis.  The nature of the procedure, its indications, and risks of bleeding, infection, bile leak, and bile duct injury were discussed and understood preoperatively.  He is now brought to the operating room for this  procedure.  DESCRIPTION OF PROCEDURE:  The patient was brought to the operating room and placed in the supine position on the operating room table.  General endotracheal anesthesia was induced.  PAS were in place.  He had been given broad spectrum antibiotics intravenously.  The abdomen was sterilely prepped and draped. Local anesthesia was used to infiltrate the skin just above the umbilicus and a 1.0 cm vertical incision was made, and dissection carried down to the midline fascia.  This was sharply incised for 1.0 cm.  The peritoneum was entered under direct vision.  The Hasson trocar was inserted through a mattress suture of #0 Vicryl.  Under direct vision a 10.0 mm trocar was placed in the subxiphoid area and a 5.0 mm trocar was placed in the lateral right abdomen.  The liver was seen to be markedly enlarged and irregular.  Video photographs were obtained.  The gallbladder was exposed and was markedly distended and edematous, consistent with acute cholecystitis.  The liver was extremely heavy and enlarged and displacing the gallbladder posteriorly and laterally.  I was able to get a grasper on the lateral fundus of the gallbladder, but there was no way to manipulate the liver whatsoever with the laparoscopic instruments, to allow even basic exposure of the infundibulum.  The gallbladder was also friable and I expect it would have torn  with any further traction.  For this reason, I elected to convert to  an open procedure.  The trocars were removed, and open instruments were counted and set up. A right subcostal incision was made and dissection was carried down through the  subcutaneous tissue, fascia, and muscle layers, using cautery, and the peritoneum entered directly.  The exposure was quite difficult due to the size and the immobility of the liver, and the infundibulum of the gallbladder was tucked under a large shelf of liver extending anterior to this.  A Thompson  retractor was used to retract the abdominal wall and carefully retract the liver, anterior of the gallbladder as much as possible.  The gallbladder was decompressed with a suction trocar, allowing better exposure.  The colon and duodenum were packed away and retracted.  Initially I divided the peritoneum along the infundibulum down toward the Calots triangle.  There was really, however, not enough mobility of the gallbladder to allow exposure of Calots triangle adequately beneath the markedly enlarged liver.  At this point I elected to take the gallbladder down retrograde. The peritoneum overlying the fundus was incised with cautery, and the gallbladder was taken down from its bed with electrocautery with moderate bleeding, controlled with cautery and a couple of clips.  The dissection was difficult due to difficulties with exposure as above, but we progressed steadily, and the gallbladder was freed progressively down to the infundibulum.  The cystic artery was identified, coursing up onto the gallbladder, and it was divided between two proximal and one distal clip.  Further careful dissection along the distal gallbladder and gallbladder-cystic duct junction eventually freed the gallbladder down to the cystic duct, which was exposed over about 1.0 cm.  At this point the operative cholangiograms were obtained with a ____ catheter through the cystic duct.  This showed a very small common bile duct, but no strictures, stones, or  obstruction, and free flow into the duodenum.  The common hepatic and intrahepatic ducts were severely involved with sclerosing cholangitis.  Following this the cholangiocath was removed.  The cystic duct was clamped and divided, and the specimen removed, and the cystic duct tied with a #2-0 Vicryl tie.  The gallbladder bed was inspected for hemostasis, which appeared complete.  We then proceeded with a liver biopsy.  The inferior edge of the anterior  right lobe of the liver was chosen and two #0 chromic sutures were placed in a V-fashion to control bleeding, and a wedge of liver was sharply excised, and the raw surface cauterized.  This was  sent for permanent section.  The abdomen was irrigated and inspected for hemostasis which was complete.  A 19-French round JP drain was placed in Morisons pouch and right upper quadrant, and brought out through a separate stab wound.  The wound was then closed in two layers with #0 and #1 running PDS.  The subcutaneous tissue as irrigated, and the skin closed with staples.  The sponge and needle counts were  correct.  The instrument count was incorrect, and a KUB is obtained which showed no instruments in the abdominal cavity.  The patient was taken to the recovery room in good condition, having tolerated he procedure well. DD:  01/01/00 TD:  01/02/00 Job: [id number]

## 2011-03-25 NOTE — Discharge Summary (Signed)
[place]  Patient:    Timothy Riggs, Timothy Riggs                       MRN: [id number] Adm. Date:  [id number] Disc. Date: [id number] Attending:  Osvaldo Human CC:         Florencia Reasons, M.D.                           Discharge Summary  DISCHARGE DIAGNOSES: 1. Acute cholecystitis. 2. Acute cholangitis. 3. Sclerosing cholangitis associated with Crohns disease.  OPERATION/PROCEDURES:  Open cholecystectomy with intraoperative cholangiogram and liver biopsy on January 01, 2000.  HISTORY OF PRESENT ILLNESS:  Timothy Riggs is a 45 year old black male with a long history of Crohns disease and sclerosing cholangitis, followed by Dr. Katy Fitch. Buccini.  About one week prior to this admission he developed persistent right upper quadrant abdominal epigastric pain.  He has been noted to have an elevated white count of 16,000, and worsening of his chronically mildly-elevated liver function tests, with bilirubin now up to 6.2, which usually runs about 2.  He had a workup consisting of an ultrasound of the gallbladder which was a thickened gallbladder wall, with no definite stones.  A HIDA scan has shown nonvisualization of the gallbladder, consistent with acute cholecystitis.  He has a history of an ERCP and common bile duct stone extraction several years ago.  The patient is felt to have acute cholecystitis, and is admitted semiurgently for a cholecystectomy.  PAST MEDICAL HISTORY:  Significant only as above.  CURRENT MEDICATIONS CHRONICALLY:  Dipentum two tablets b.i.d.  ALLERGIES:  No known drug allergies.  SOCIAL HISTORY/FAMILY HISTORY/REVIEW OF SYSTEMS:  See the detailed H&P.  PHYSICAL EXAMINATION:  VITAL SIGNS:  Temperature 98.9 degrees, pulse 86, respirations 18, blood pressure 130/80.  GENERAL:  He is a pleasant black male, who appears mildly ill.  HEENT:  Significant for scleral icterus.  ABDOMEN:  Revealed tenderness in the  right upper quadrant well-localized with guarding.  HOSPITAL COURSE:  On the day of admission he was taken to the operating room.  laparoscopy was performed with the findings of a significantly enlarged cirrhotic-appearing liver and acute cholecystitis.  The patient underwent open cholecystectomy with an intraoperative cholangiogram.  This was consistent with  significant sclerosing cholangitis with narrowed beaded intrahepatic ducts, and  some mild narrowing of the common bile duct but free flow into the duodenum. The patient was treated with broad-spectrum antibiotics perioperatively.  He has had a fever of 101 degrees on the first postoperative day.  At this time the white count was increased to 24,000, and bilirubin was decreased to 4.5.  He was continued n IV antibiotics and a clear liquid diet started.  He seemed significantly improved on the second postoperative day with the temperature down to 99.5 degrees, and ess pain and a benign abdomen.  On the third postoperative day he was noted to have had some nausea and vomiting the previous night, but felt better.  The abdomen showed some mild distention.  The white count was decreased to 20,000; however, his bilirubin was increased to 5.2, alkaline phosphatase was 275, SGOT and SGPT 87 nd 37.  He was felt at this time to have an ileus.  Hyponatremia was also noted, and he was treated with IV fluids and some fluid restriction.  The pathology report was consistent with sclerosing cholangitis, but also acute cholangitis as well.  He was continued on IV Rocephin.  On January 05, 2000, his temperature was increased to 102 degrees.  Bilirubin as increased to 8.8 and white count down to 17,000.  Sodium was up to 129.  He was  continued on IV antibiotics for apparent acute cholangitis, probably exacerbated by his sclerosing cholangitis.  On January 06, 2000, he continued to have a low-grade  fever and some abdominal distention  and nausea.  The abdomen was noted to be much more distended, with fluid wave consistent with ascites, and the sodium was decreased to 123.  The white count and liver function tests remained elevated.  Dr. Matthias Hughs was reconsulted and he was felt to have ascites, secondary to his liver fibrosis, and smoldering acute cholangitis.  He was treated with fluid restriction for his hyponatremia and Lasix for his ascites.  On January 07, 2000, his bilirubin was down to 7 and white count down to 17,000, sodium up to 127.  He felt significantly better.  He continued to gradually improve from this time.  He did have some ascites leak from his lateral port site, that was managed with dressing changes.  His temperature trended down.  He remained afebrile.  He was switched to p.o. antibiotics.  The white count and liver function tests remained moderately  elevated.  He clinically, however, was markedly improved.  DISPOSITION:  At this point we elected to discharge him on p.o. Augmentin with close followup.  At the time of discharge, his bilirubin had decreased to 4.7 and white count had reduced from 18,000 to 10,000.  He was pain free and essentially feeling well at this time.  DISCHARGE MEDICATIONS: 1. Same as admission, Dipentum two tablets b.i.d. 2. Augmentin. 3. K-Dur. 4. Lasix. 5. Aldactone.  FOLLOWUP:  In Dr. Haig Prophet office and in my office in two weeks. DD:  03/01/00 TD:  03/02/00 Job: [id number]

## 2011-03-25 NOTE — Procedures (Signed)
[facility]  Patient:    Timothy Riggs, Timothy Riggs                       MRN: [id number] Proc. Date: 09/11/00 Adm. Date:  [id number] Attending:  Rich Brave CC:         Brooke Dare, M.D., Div. of GI, [id number], [facility] [place], [place] [address]                           Procedure Report  PROCEDURE:  Upper endoscopy.  ENDOSCOPIST:  Florencia Reasons, M.D.  INDICATIONS:  A 45 year old male with longstanding ulcerative colitis and advanced liver disease secondary to sclerosing cholangitis.  Rule out varices.  FINDINGS:  There were 1+ varices present.  DESCRIPTION OF PROCEDURE:  The patient provided written consent for the procedure after coming in fasting state to the endoscopy.  Sedation was fentanyl 62.5 mcg and Versed 6 mg IV without arrhythmias or desaturation.  The Olympus GIF100 adult video endoscope was passed under direct vision.  The vocal cords were not well seen.  There were no gross laryngeal abnormalities appreciated, and the esophagus was quite easily entered upon swallowing.  The proximal esophagus was normal, but the distal esophagus had 1 t 2+ varices which did not flatten out entirely with insufflation but which were more prominent when the esophagus was decompressed.  There was no evidence of reflux esophagitis, Barretts esophagus, infection, or neoplasia.  No ring, stricture, or significant hiatal hernia was apparent.  The stomach was entered.  There was moderate gastric residual of bilious fluid which was suctioned up.  Retroflexed viewing showed what appeared to be a small hiatal hernia but no obvious gastric varices.  The cardia of the stomach had some erythema which appeared perhaps to be due to the patients retching with associated mucosal trauma rather than any intrinsic gastric abnormality.  The remainder of the stomach was unremarkable, without evidence of gastritis, erosions, ulcers, polyps, or ulcers.  The pylorus,  duodenal bulb, and second duodenum were grossly unremarkable.  No biopsies were obtained.  The patient tolerated this procedure well, and there were no apparent complications.  IMPRESSION: 1. Small esophageal varices. 2. Possible small hiatal hernia.  PLAN:  Continue propranolol for prophylaxis against variceal hemorrhage. DD:  09/11/00 TD:  09/11/00 Job: [id number]

## 2011-04-11 ENCOUNTER — Other Ambulatory Visit: Payer: Self-pay | Admitting: Gastroenterology

## 2011-10-14 ENCOUNTER — Other Ambulatory Visit: Payer: Self-pay | Admitting: Family Medicine

## 2011-10-14 DIAGNOSIS — I10 Essential (primary) hypertension: Secondary | ICD-10-CM

## 2011-11-25 ENCOUNTER — Ambulatory Visit (INDEPENDENT_AMBULATORY_CARE_PROVIDER_SITE_OTHER): Payer: BC Managed Care – PPO | Admitting: Family Medicine

## 2011-11-25 ENCOUNTER — Encounter: Payer: Self-pay | Admitting: Family Medicine

## 2011-11-25 VITALS — BP 116/82 | HR 92 | Temp 98.3°F | Ht 73.5 in | Wt 254.0 lb

## 2011-11-25 DIAGNOSIS — M109 Gout, unspecified: Secondary | ICD-10-CM

## 2011-11-25 DIAGNOSIS — R7309 Other abnormal glucose: Secondary | ICD-10-CM

## 2011-11-25 DIAGNOSIS — I10 Essential (primary) hypertension: Secondary | ICD-10-CM

## 2011-11-25 MED ORDER — HYDROCHLOROTHIAZIDE 50 MG PO TABS: 50.0000 mg | ORAL_TABLET | Freq: Every day | ORAL | Status: DC

## 2011-11-25 NOTE — Patient Instructions (Addendum)
Mr. Timothy Riggs,   Thank you for coming in to see me today. I have sent a refill to your pharmacy. I will call you with the results of your blood work. Please be sure to have other future labs sent to me: attn Dr. Dessa Phi  Fax [phone].  As a part of healthy lifestyle and BP control: low salt diet (no extra salt to food, limiting processed foods) and regular exercise is recommended.   Plan to f/u in 1 year or sooner if needed.   -Dr. Armen Pickup   Called pt at home. D/Cd HCTZ, start lisinopril in 3 days. F/u with blood draw in 3 weeks.

## 2011-11-26 LAB — COMPREHENSIVE METABOLIC PANEL
ALT: 34 U/L (ref 0–53)
Albumin: 4.7 g/dL (ref 3.5–5.2)
CO2: 30 mEq/L (ref 19–32)
Calcium: 9.8 mg/dL (ref 8.4–10.5)
Chloride: 98 mEq/L (ref 96–112)
Creat: 1.02 mg/dL (ref 0.50–1.35)
Potassium: 3.3 mEq/L — ABNORMAL LOW (ref 3.5–5.3)
Total Protein: 7.4 g/dL (ref 6.0–8.3)

## 2011-11-26 LAB — LDL CHOLESTEROL, DIRECT: Direct LDL: 93 mg/dL

## 2011-11-28 ENCOUNTER — Encounter: Payer: Self-pay | Admitting: Family Medicine

## 2011-11-28 DIAGNOSIS — M109 Gout, unspecified: Secondary | ICD-10-CM | POA: Insufficient documentation

## 2011-11-28 MED ORDER — LISINOPRIL 10 MG PO TABS: 10.0000 mg | ORAL_TABLET | Freq: Every day | ORAL | Status: DC

## 2011-11-28 NOTE — Progress Notes (Signed)
Subjective:     Patient ID: Timothy Riggs, male   DOB: 1966-01-28, 46 y.o.   MRN: [id number]  HPI 46 yo M presents for HTN f/u. He has not been to the clinic for some time because he has had most of his care provided at [facility] for ulcerative colits and non-alcoholic hepatitis.   HTN: he its taking HCTZ 50 mg PO daily. He does not check his BP at home. He denies HA, blurred vision, CP, SOB or LE edema. He has lost about 30 lbs since his total colectomy. He dose not exercise regularly, he eats a well rounded diet.   Pre-diabetes: diet controlled. Denies dizziness, N/V/D, tingling or numbness in extremities. Has appt with eye doctor scheduled for next month.   Review of Systems As per HPI    Past Medical History  Diagnosis Date  . 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 Hypertension   . Ulcerative colitis 1990    s/p colectomy   Past Surgical History  Procedure Date  . Liver transplant 2006    [place]  . Total colectomy 07/2011 and     for ulcerative colitis, ostomy x 1 mos, reversal in Nov 2012   Objective:   Physical Exam Filed Vitals:   11/25/11 1437  BP: 116/82  Pulse: 92  Temp: 98.3 F (36.8 C)  General appearance: alert, cooperative and no distress Head: Normocephalic, without obvious abnormality, atraumatic Eyes: conjunctivae/corneas clear. PERRL, EOM's intact. Fundi benign. Neck: no adenopathy, no carotid bruit, no JVD, supple, symmetrical. Lungs: clear to auscultation bilaterally Heart: regular rate and rhythm, S1, S2 normal, no murmur, click, rub or gallop Extremities: extremities normal, atraumatic, no cyanosis or edema    Assessment:         Plan:

## 2011-11-28 NOTE — Assessment & Plan Note (Signed)
A: A1c 5.7 P: counsel on increase exercise and eating a healthy, low processed carb diet.

## 2011-11-28 NOTE — Assessment & Plan Note (Addendum)
A: BP well controlled.  Med: Pt compliant with HCTZ , but an ACE would be better given his gout.  P: -check baseline labs, esp K and Cr -Start lisinopril 10 mg q Daily -recheck K and Cr in 3 weeks. Will tolerate up to a 30% increase in Cr.

## 2012-01-28 ENCOUNTER — Other Ambulatory Visit: Payer: Self-pay | Admitting: Family Medicine

## 2012-06-20 ENCOUNTER — Encounter: Payer: Self-pay | Admitting: Family Medicine

## 2013-01-01 ENCOUNTER — Encounter: Payer: Self-pay | Admitting: Family Medicine

## 2013-01-01 ENCOUNTER — Ambulatory Visit (HOSPITAL_COMMUNITY)
Admission: RE | Admit: 2013-01-01 | Discharge: 2013-01-01 | Disposition: A | Discharge status: Home / Self Care | Payer: Commercial Managed Care - PPO | Source: Ambulatory Visit | Attending: Family Medicine | Admitting: Family Medicine

## 2013-01-01 ENCOUNTER — Ambulatory Visit (INDEPENDENT_AMBULATORY_CARE_PROVIDER_SITE_OTHER): Payer: Commercial Managed Care - PPO | Admitting: Family Medicine

## 2013-01-01 VITALS — BP 152/96 | HR 94 | Temp 97.8°F | Ht 73.5 in | Wt 258.0 lb

## 2013-01-01 DIAGNOSIS — R197 Diarrhea, unspecified: Secondary | ICD-10-CM

## 2013-01-01 DIAGNOSIS — R109 Unspecified abdominal pain: Secondary | ICD-10-CM | POA: Insufficient documentation

## 2013-01-01 DIAGNOSIS — Z944 Liver transplant status: Secondary | ICD-10-CM

## 2013-01-01 DIAGNOSIS — I10 Essential (primary) hypertension: Secondary | ICD-10-CM

## 2013-01-01 DIAGNOSIS — R112 Nausea with vomiting, unspecified: Secondary | ICD-10-CM | POA: Insufficient documentation

## 2013-01-01 LAB — CBC WITH DIFFERENTIAL/PLATELET
Basophils Absolute: 0 10*3/uL (ref 0.0–0.1)
Basophils Relative: 1 % (ref 0–1)
Eosinophils Relative: 7 % — ABNORMAL HIGH (ref 0–5)
HCT: 47.5 % (ref 39.0–52.0)
MCHC: 35.6 g/dL (ref 30.0–36.0)
MCV: 82 fL (ref 78.0–100.0)
Monocytes Absolute: 0.7 10*3/uL (ref 0.1–1.0)
Platelets: 202 10*3/uL (ref 150–400)
RDW: 14.2 % (ref 11.5–15.5)

## 2013-01-01 LAB — COMPREHENSIVE METABOLIC PANEL
AST: 38 U/L — ABNORMAL HIGH (ref 0–37)
Alkaline Phosphatase: 56 U/L (ref 39–117)
BUN: 19 mg/dL (ref 6–23)
Creat: 1.2 mg/dL (ref 0.50–1.35)
Total Bilirubin: 1.5 mg/dL — ABNORMAL HIGH (ref 0.3–1.2)

## 2013-01-01 NOTE — Patient Instructions (Addendum)
It was good to meet you today.  Please follow up to see someone in 2 days. We are collecting labs and I will call you if they are abnormal. I want you to get an abdominal xray at [facility] which I am ordering. I will look into refilling your medications - will call you about this.  I think you have a viral gastroenteritis. Please wash hands and try to stay very hydrated.   Feel free to call with questions or concerning symptoms.

## 2013-01-01 NOTE — Progress Notes (Signed)
Subjective:     Patient ID: Timothy Riggs, male   DOB: 02-07-1966, 47 y.o.   MRN: [id number]  CC - nausea, vomiting, diarrhea  HPI  Timothy Riggs is a 47 y.o. male with h/o ulcerative colitis s/p colectomy Aug 2012, liver transplant 7 years ago, HTN, and pre-diabetes here with nausea, vomiting, and diarrhea. Per patient, he has had about 2 weeks of intermittent "burping raw eggs" that comes on every 3 days and lasts 1 day and headache. Yesterday stomach felt "sour" and in the evening patient reports 4/10 crampy abdominal pain and feeling bloated, and around 1 am had emesis. Ate nothing all day and now reports feeling better. He then started having watery nonbloody diarrhea. Pt denies fever, chills, rash, shortness of breath, sick contacts, new medications/antibiotics. He reports getting flu shot this year.  Of note, patient gets stomach upset 1-2 times annually.  Review of Systems Per HPI.  PMH, SH, and FH reviewed with the only contribution being that patient reports entire colon was removed, so at baseline stools are a pudding consistency. He also recently started using brand-name for cellcept in January. Denies tobacco, drugs, alcohol    Objective:   Physical Exam BP 152/96  Pulse 94  Temp(Src) 97.8 F (36.6 C) (Oral)  Ht 6' 1.5" (1.867 m)  Wt 258 lb (117.028 kg)  BMI 33.57 kg/m2  GEN: NAD, sitting on exam table, pleasant HEENT: EOMI, MMM, no scleral icterus ABD: Soft, nontender, nondistended, hyperactive bowel sounds, numerous surgical scars, obese SKIN: No rash or cyanosis     Assessment:     47 y.o. male with h/o ulcerative colitis s/p colectomy Aug 2012 and liver transplant 7 years ago with nausea, vomiting, and diarrhea most likely related to a viral gastroenteritis. However, afebrile and no sick contacts along with time course make this unlikely. With significant GI and surgical hx, also consider obstruction though less likely.     Plan:

## 2013-01-02 ENCOUNTER — Ambulatory Visit: Payer: BC Managed Care – PPO

## 2013-01-02 MED ORDER — AMLODIPINE BESYLATE 10 MG PO TABS: ORAL_TABLET | ORAL | Status: AC

## 2013-01-02 NOTE — Assessment & Plan Note (Addendum)
Likely viral gastro. Will err on the side of caution in patient with h/o significant abdominal surgery to rule out adhesion causing obstruction, though this is not very likely. Mildly elevated eosinophils possibly secondary to autoimmune illness. - Abdominal x-ray (flat and upright) - **right-sided abdominal wall hernia** - Unlikely symptomatic without causing bowel obstruction. F/u with GI specialist - CBC with differential and comprehensive metabolic panel **Normal WBC with 7% eosinophils, Tbili 1.5 (previously 0.8), AST 38, Cr 1.2 (previously 1.02 baseline, likely some dehydration). Though not a significant increase in bili and AST, likely should have close follow-up with [facility] GI specialist.  - Recommended hydration and handwashing - Return precautions reviewed for signs of sepsis/dehydration - F/u in 2 days; if no better, patient may need abdominal CT; consider lipase/amylase on return as well

## 2013-01-02 NOTE — Assessment & Plan Note (Addendum)
Currently elevated likely secondary to pain. - Refill 1 month of norvasc and follow-up with PCP

## 2013-01-03 ENCOUNTER — Ambulatory Visit (INDEPENDENT_AMBULATORY_CARE_PROVIDER_SITE_OTHER): Payer: Commercial Managed Care - PPO | Admitting: Family Medicine

## 2013-01-03 ENCOUNTER — Encounter: Payer: Self-pay | Admitting: Family Medicine

## 2013-01-03 VITALS — BP 128/84 | HR 67 | Temp 97.7°F | Ht 73.5 in | Wt 263.0 lb

## 2013-01-03 DIAGNOSIS — R112 Nausea with vomiting, unspecified: Secondary | ICD-10-CM

## 2013-01-03 DIAGNOSIS — R197 Diarrhea, unspecified: Secondary | ICD-10-CM

## 2013-01-03 DIAGNOSIS — I10 Essential (primary) hypertension: Secondary | ICD-10-CM

## 2013-01-03 MED ORDER — LISINOPRIL 10 MG PO TABS: 10.0000 mg | ORAL_TABLET | Freq: Every day | ORAL | Status: AC

## 2013-01-03 NOTE — Assessment & Plan Note (Signed)
Clinically improving.  Likely mild viral GI, with some continued acute watery diarrhea.  Well hydrated and clinically well.  Advised watchful waiting and to return soon if abd pain or fever.  Also advised to return if continues to have diarrhea for 2 weeks, would consider stools studies at that time   Given history of immunocompromise.

## 2013-01-03 NOTE — Progress Notes (Signed)
  Subjective:    Patient ID: LORI LIEW, male    DOB: 09-Dec-1965, 47 y.o.   MRN: [id number]  HPI 47 yo here for 2 days follow-up of emesis and diarrhea  Patient states no emesis in 2 days.  Continued water diarrhea now 2-3 days in duration.  No abd pain, fever, chills.  Otherwise feels at baseline  When seen 2 days ago, had CBC which showed no elevated WBC and CMET showed mild elavtion of bilirubin and LFT  PMH sig for liver transplant and colectomy, not recently.  On myfortic and prograf   Review of Systemssee HPI     Objective:   Physical Exam GEN: Alert & Oriented, No acute distress, here with his wife CV:  Regular Rate & Rhythm, no murmur Respiratory:  Normal work of breathing, CTAB Abd:  + BS, soft, no tenderness to palpation         Assessment & Plan:

## 2013-01-03 NOTE — Assessment & Plan Note (Signed)
At goal today, refilled lisinopril today, follow-up with PCP

## 2013-01-03 NOTE — Patient Instructions (Addendum)
Follow-up if you have abdominal pain, fever, or are feeling sick.  Also come in if you diarrhea does not improve after 2 weeks.

## 2013-01-08 ENCOUNTER — Telehealth: Payer: Self-pay | Admitting: Family Medicine

## 2013-01-08 ENCOUNTER — Encounter: Payer: Self-pay | Admitting: Family Medicine

## 2013-01-08 NOTE — Telephone Encounter (Signed)
Called pt 2/28 to check on him and review labs with him. Pt states he is doing better. Recommended with slightly elevated Tbili and AST (very slightly elevated from baseline/normal), recommend f/u with GI specialist. Pt states he has f/u May and will call for sooner if he has recurrent symptoms.

## 2013-01-08 NOTE — Telephone Encounter (Signed)
Spoke with Dr. Armen Pickup who is okay with pt taking just one antihypertensive right now due to good BP at last visit. Pt states he has both norvasc and lisinopril (due to uncertainty at last visit if he was taking both) but has been taking only lisinopril until he heard back. Asked patient to only take lisinopril and f/u with PCP.

## 2013-05-03 IMAGING — CR DG ABDOMEN 2V
3 series · 3 of 3 positions shown · non-contrast
Comparison: None

CLINICAL DATA: Abdominal pain.

ABDOMEN - 2 VIEW

[w abdomen upright]
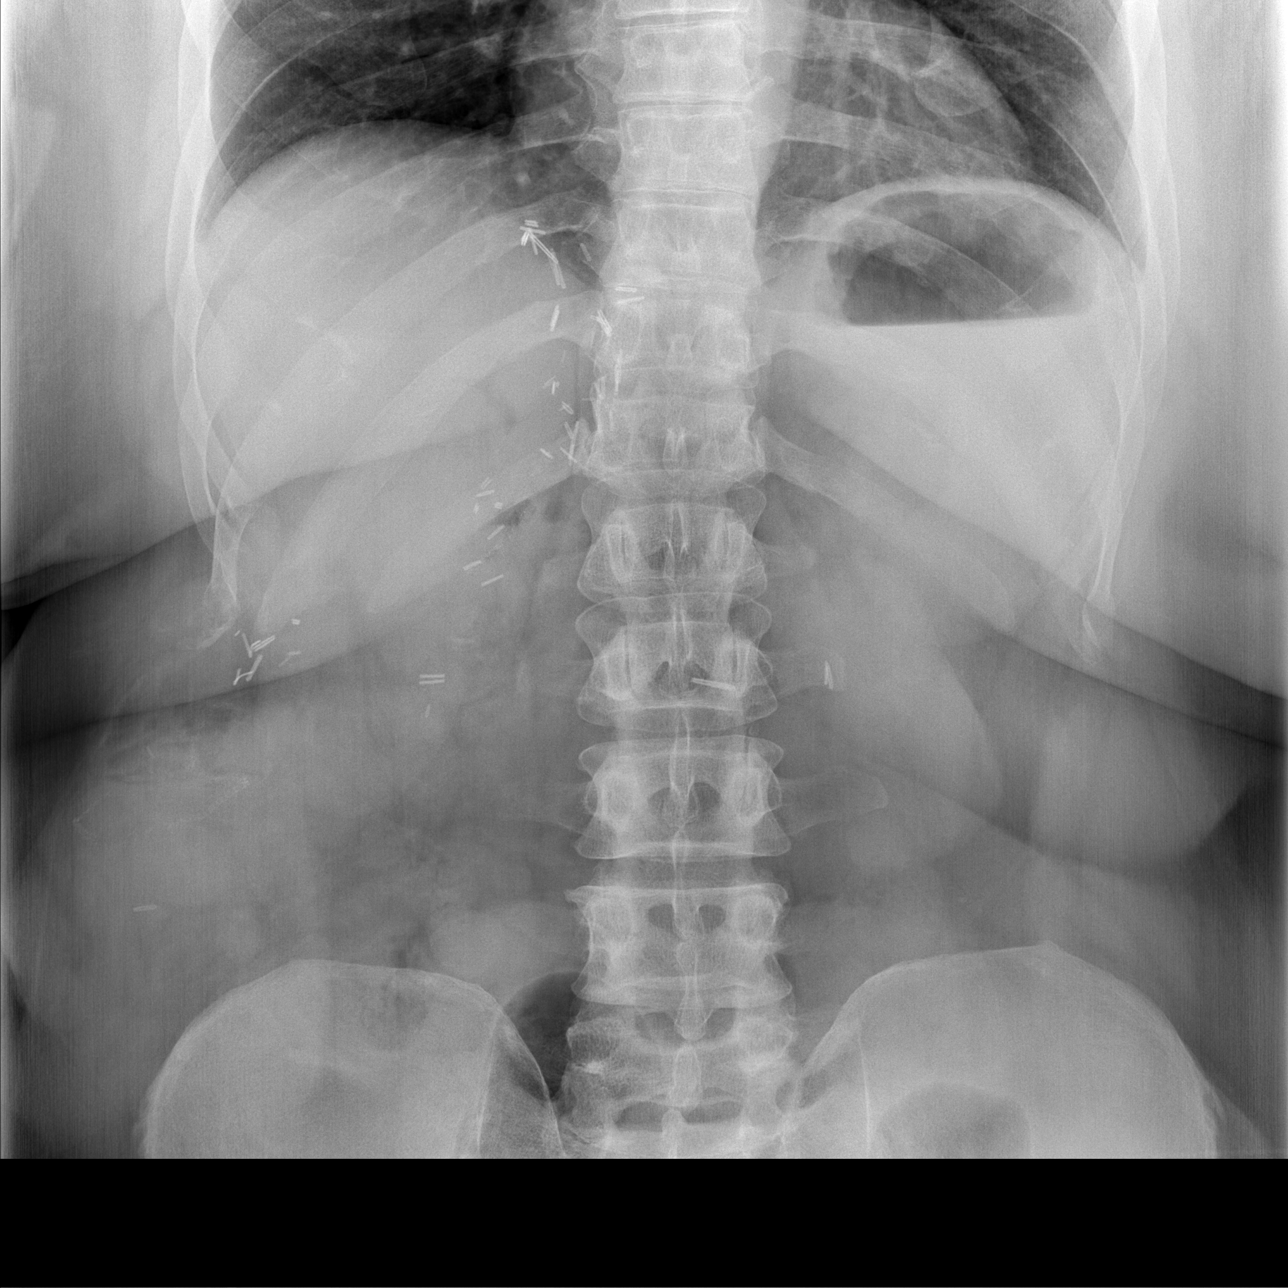

[t abdomen supine (1 of 2)]
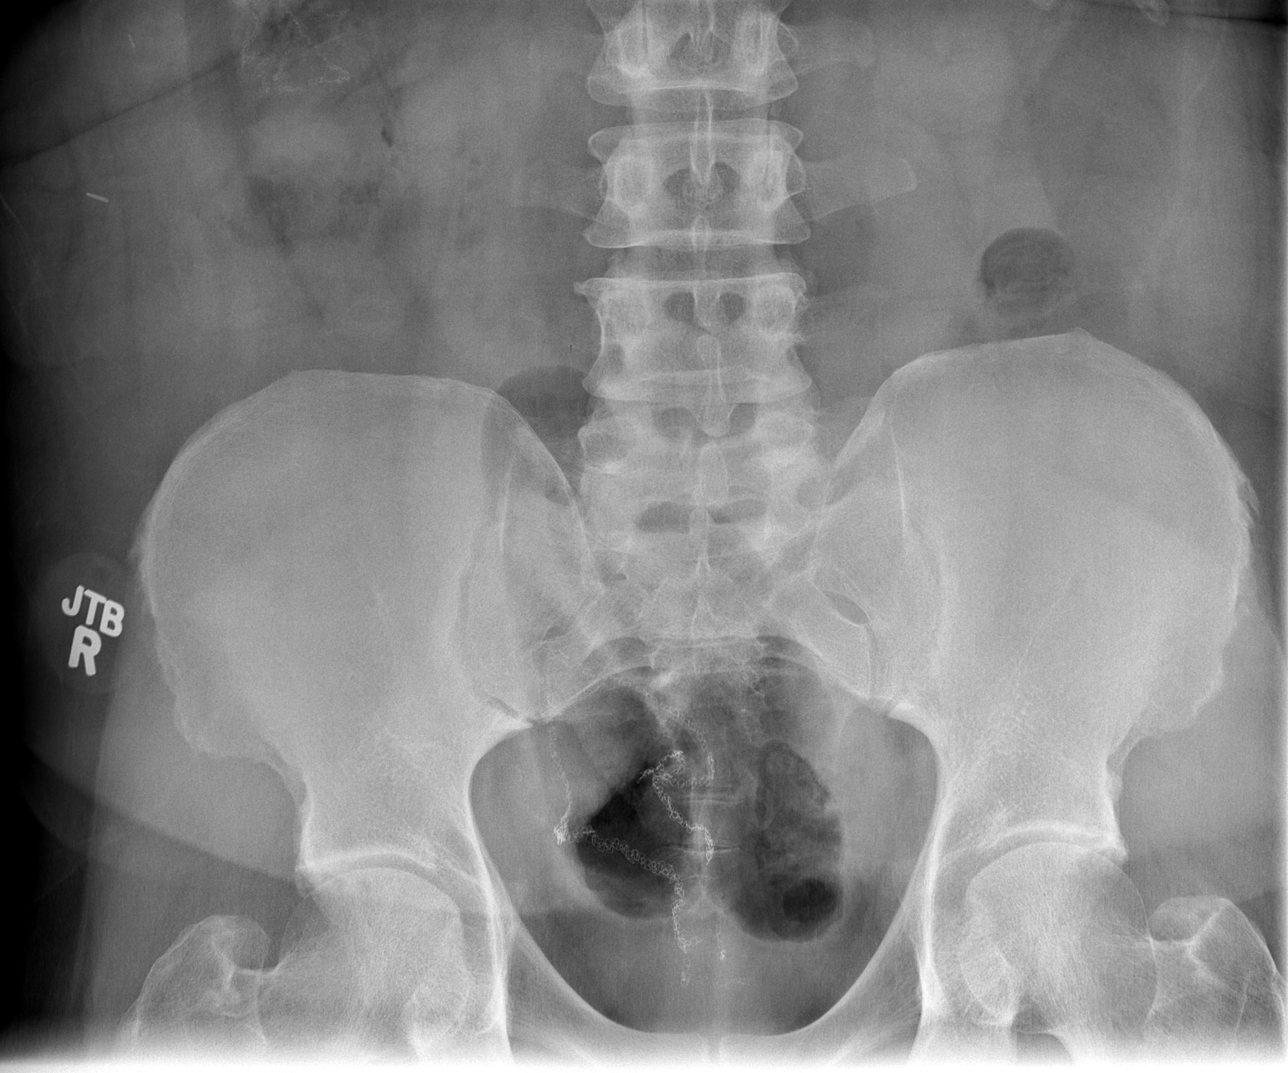

[t abdomen supine (2 of 2)]
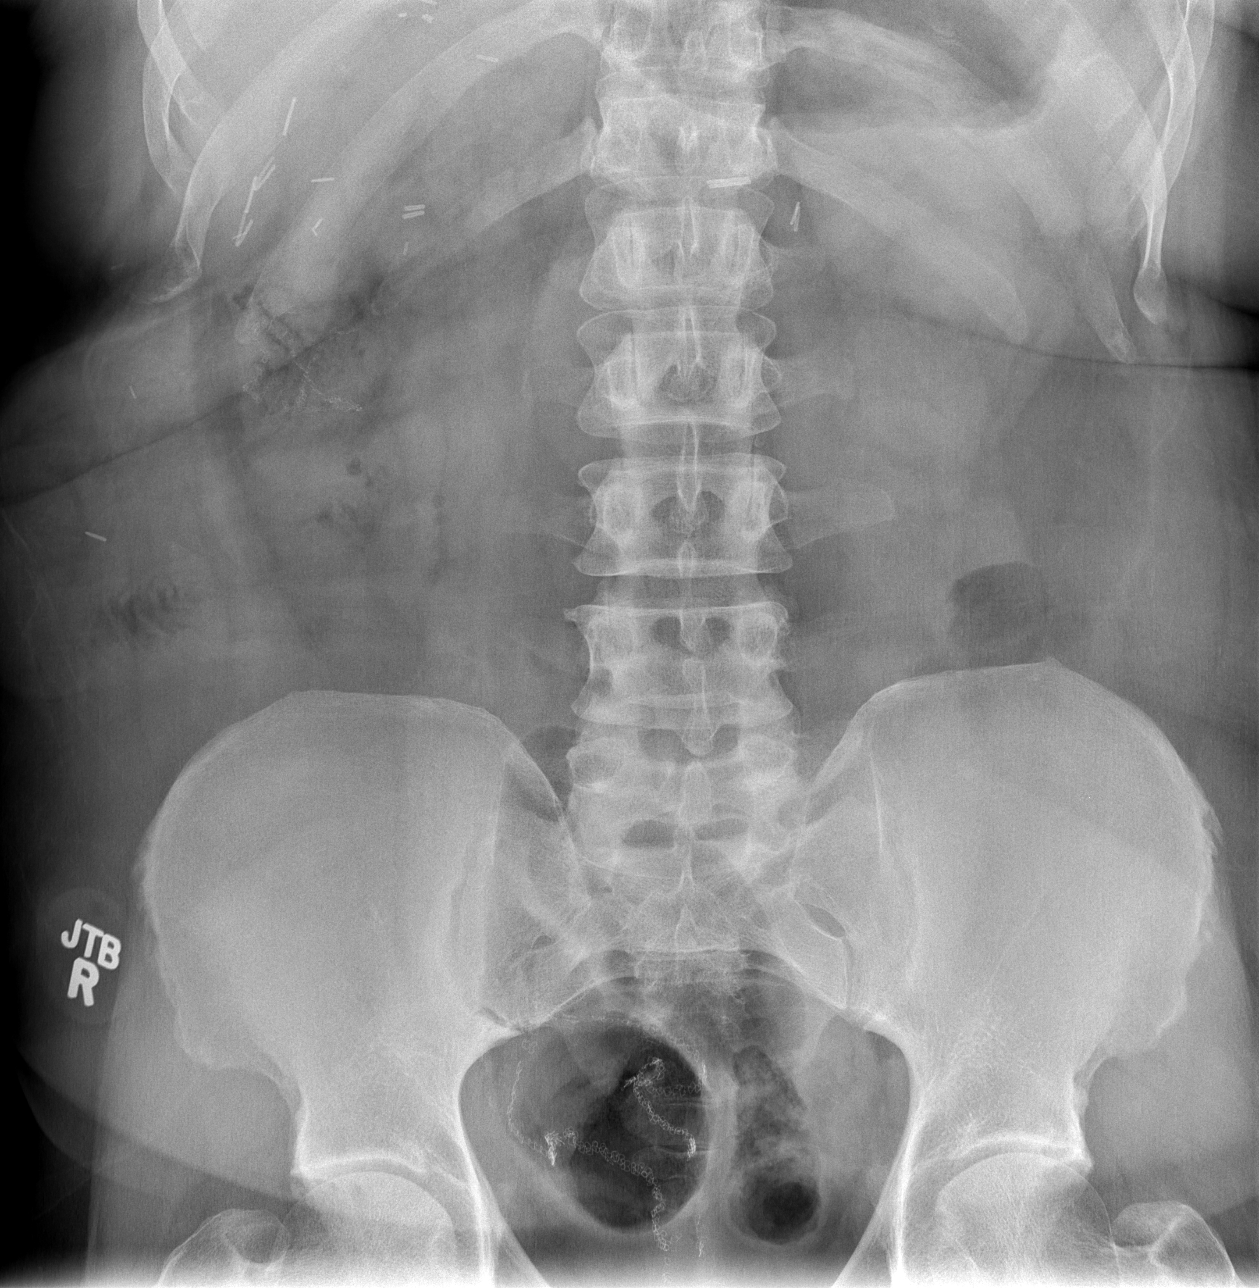

[3 of 3 positions shown; findings below may reference images not displayed]

FINDINGS: The lung bases are clear.  No pleural effusions.
Extensive surgical changes are noted.  The bowel gas pattern is
unremarkable.  No findings for obstruction and/or perforation.  The
soft tissue shadows are maintained.  No worrisome calcifications.
The bony structures are intact.  Suspect a right-sided abdominal
wall hernia.
IMPRESSION: No plain film findings for an acute abdominal process.
Extensive surgical changes.
Suspect a right-sided abdominal wall hernia.

## 2013-09-12 ENCOUNTER — Other Ambulatory Visit: Payer: Self-pay

## 2014-10-07 ENCOUNTER — Encounter (HOSPITAL_COMMUNITY): Payer: Self-pay | Admitting: *Deleted

## 2014-10-07 ENCOUNTER — Emergency Department (HOSPITAL_COMMUNITY)
Admission: EM | Admit: 2014-10-07 | Discharge: 2014-10-07 | Disposition: A | Discharge status: Home / Self Care | Payer: Commercial Managed Care - PPO | Attending: Emergency Medicine | Admitting: Emergency Medicine

## 2014-10-07 DIAGNOSIS — R197 Diarrhea, unspecified: Secondary | ICD-10-CM | POA: Insufficient documentation

## 2014-10-07 DIAGNOSIS — I10 Essential (primary) hypertension: Secondary | ICD-10-CM | POA: Diagnosis not present

## 2014-10-07 DIAGNOSIS — Z79899 Other long term (current) drug therapy: Secondary | ICD-10-CM | POA: Diagnosis not present

## 2014-10-07 DIAGNOSIS — R112 Nausea with vomiting, unspecified: Secondary | ICD-10-CM | POA: Insufficient documentation

## 2014-10-07 DIAGNOSIS — Z8719 Personal history of other diseases of the digestive system: Secondary | ICD-10-CM | POA: Insufficient documentation

## 2014-10-07 DIAGNOSIS — R5383 Other fatigue: Secondary | ICD-10-CM | POA: Insufficient documentation

## 2014-10-07 DIAGNOSIS — Z8739 Personal history of other diseases of the musculoskeletal system and connective tissue: Secondary | ICD-10-CM | POA: Insufficient documentation

## 2014-10-07 DIAGNOSIS — E860 Dehydration: Secondary | ICD-10-CM

## 2014-10-07 LAB — COMPREHENSIVE METABOLIC PANEL
ALBUMIN: 4.5 g/dL (ref 3.5–5.2)
ALT: 69 U/L — ABNORMAL HIGH (ref 0–53)
AST: 37 U/L (ref 0–37)
Alkaline Phosphatase: 90 U/L (ref 39–117)
Anion gap: 20 — ABNORMAL HIGH (ref 5–15)
BUN: 69 mg/dL — AB (ref 6–23)
CO2: 13 mEq/L — ABNORMAL LOW (ref 19–32)
CREATININE: 2.01 mg/dL — AB (ref 0.50–1.35)
Calcium: 10 mg/dL (ref 8.4–10.5)
Chloride: 94 mEq/L — ABNORMAL LOW (ref 96–112)
GFR calc Af Amer: 44 mL/min — ABNORMAL LOW (ref >90)
GFR calc non Af Amer: 38 mL/min — ABNORMAL LOW (ref >90)
Glucose, Bld: 173 mg/dL — ABNORMAL HIGH (ref 70–99)
POTASSIUM: 4.6 meq/L (ref 3.7–5.3)
Sodium: 127 mEq/L — ABNORMAL LOW (ref 137–147)
TOTAL PROTEIN: 10.2 g/dL — AB (ref 6.0–8.3)
Total Bilirubin: 1.2 mg/dL (ref 0.3–1.2)

## 2014-10-07 LAB — URINE MICROSCOPIC-ADD ON

## 2014-10-07 LAB — URINALYSIS, ROUTINE W REFLEX MICROSCOPIC
Bilirubin Urine: NEGATIVE
Glucose, UA: NEGATIVE mg/dL
Ketones, ur: NEGATIVE mg/dL
LEUKOCYTES UA: NEGATIVE
NITRITE: NEGATIVE
Protein, ur: 30 mg/dL — AB
SPECIFIC GRAVITY, URINE: 1.022 (ref 1.005–1.030)
UROBILINOGEN UA: 0.2 mg/dL (ref 0.0–1.0)
pH: 5 (ref 5.0–8.0)

## 2014-10-07 LAB — LIPASE, BLOOD: LIPASE: 18 U/L (ref 11–59)

## 2014-10-07 LAB — CBC WITH DIFFERENTIAL/PLATELET
BASOS ABS: 0 10*3/uL (ref 0.0–0.1)
BASOS PCT: 0 % (ref 0–1)
EOS ABS: 0 10*3/uL (ref 0.0–0.7)
Eosinophils Relative: 0 % (ref 0–5)
HCT: 50 % (ref 39.0–52.0)
HEMOGLOBIN: 18.8 g/dL — AB (ref 13.0–17.0)
Lymphocytes Relative: 17 % (ref 12–46)
Lymphs Abs: 2.2 10*3/uL (ref 0.7–4.0)
MCH: 30.1 pg (ref 26.0–34.0)
MCHC: 37.6 g/dL — AB (ref 30.0–36.0)
MCV: 80 fL (ref 78.0–100.0)
MONOS PCT: 9 % (ref 3–12)
Monocytes Absolute: 1.2 10*3/uL — ABNORMAL HIGH (ref 0.1–1.0)
NEUTROS PCT: 73 % (ref 43–77)
Neutro Abs: 9.2 10*3/uL — ABNORMAL HIGH (ref 1.7–7.7)
Platelets: 257 10*3/uL (ref 150–400)
RBC: 6.25 MIL/uL — ABNORMAL HIGH (ref 4.22–5.81)
RDW: 13.1 % (ref 11.5–15.5)
WBC: 12.5 10*3/uL — ABNORMAL HIGH (ref 4.0–10.5)

## 2014-10-07 MED ORDER — ONDANSETRON HCL 4 MG PO TABS: 4.0000 mg | ORAL_TABLET | Freq: Four times a day (QID) | ORAL | Status: DC

## 2014-10-07 MED ORDER — SODIUM CHLORIDE 0.9 % IV BOLUS (SEPSIS)
1000.0000 mL | Freq: Once | INTRAVENOUS | Status: AC
  Administered 2014-10-07: 1000 mL via INTRAVENOUS

## 2014-10-07 MED ORDER — ACETAMINOPHEN 500 MG PO TABS
1000.0000 mg | ORAL_TABLET | Freq: Once | ORAL | Status: AC
  Administered 2014-10-07: 1000 mg via ORAL
  Filled 2014-10-07: qty 2

## 2014-10-07 MED ORDER — ONDANSETRON HCL 4 MG/2ML IJ SOLN
4.0000 mg | Freq: Once | INTRAMUSCULAR | Status: AC
  Administered 2014-10-07: 4 mg via INTRAVENOUS
  Filled 2014-10-07: qty 2

## 2014-10-07 MED ORDER — SODIUM CHLORIDE 0.9 % IV BOLUS (SEPSIS)
500.0000 mL | Freq: Once | INTRAVENOUS | Status: AC
  Administered 2014-10-07: 500 mL via INTRAVENOUS

## 2014-10-07 MED ORDER — LOPERAMIDE HCL 2 MG PO CAPS: 2.0000 mg | ORAL_CAPSULE | Freq: Four times a day (QID) | ORAL | Status: AC | PRN

## 2014-10-07 NOTE — ED Notes (Signed)
Family at bedside. 

## 2014-10-07 NOTE — ED Notes (Addendum)
Patient complains of headache after ambulation. PA noticed,

## 2014-10-07 NOTE — ED Provider Notes (Signed)
CSN: [id number]     Arrival date & time 10/07/14  [id number] History   First MD Initiated Contact with Patient 10/07/14 [phone]     Chief Complaint  Patient presents with  . Emesis  . Diarrhea   HPI  Patient is a 48 year old male with a past medical history of ulcerative colitis status post colectomy, gout, Elita [place]one[place] status post liver transplantation in 2006 who presents to the emergency room with his wife for evaluation of nausea, vomiting, and diarrhea 5 days. Patient states that last Friday he developed a "sour stomach" and then developed nausea and vomiting. Patient states that originally the vomit was food product, but has now progressed to a yellow green foamy vomit. Patient states that he is vomiting 5-6 times per day. Patient has also had diarrhea since Saturday morning. He states that he is having 5-6 bowel movements per day. He states his usual bowel movement is soft, but now his bowel movements are clear to yellow and liquid without formation. He denies melena or hematochezia. He denies hematemesis. He is currently taking CellCept and and Prograf for his liver transplant. He states that these are the only medications that he has been able to take. He has not taking his gout medication or his hypertensive medication. He has also had his gallbladder removed. Patient has no sick contacts, no suspicious food intake, no recent travel, or antibiotic use. He denies any current abdominal pain at this time.  Past Medical History  Diagnosis Date  . 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 Hypertension   . Ulcerative colitis 1990    s/p colectomy  . Gout attack 2012    [facility]   . Sclerosing cholangitis    Past Surgical History  Procedure Laterality Date  . Liver transplant  2006    [place]  . Total colectomy  07/2011 and     for ulcerative colitis, ostomy x 1 mos, reversal in Nov 2012   Family History  Problem Relation Age of Onset  .  Ulcerative colitis Brother   . Cirrhosis Brother     non- alcoholic   . Diabetes Mother   . Diabetes Father   . Heart disease Father 2860    s/p triple bypass in 2005   History  Substance Use Topics  . Smoking status: Never Smoker   . Smokeless tobacco: Never Used  . Alcohol Use: No    Review of Systems  Constitutional: Positive for fatigue. Negative for fever and chills.  Respiratory: Negative for cough, chest tightness and shortness of breath.   Cardiovascular: Negative for chest pain and palpitations.  Gastrointestinal: Positive for nausea, vomiting and diarrhea. Negative for abdominal pain, constipation, blood in stool, abdominal distention and anal bleeding.  Genitourinary: Negative for dysuria, urgency, frequency, hematuria and difficulty urinating.  Musculoskeletal: Negative for myalgias, back pain and arthralgias.  Skin: Negative for rash.  All other systems reviewed and are negative.     Allergies  Review of patient's allergies indicates no known allergies.  Home Medications   Prior to Admission medications   Medication Sig Start Date End Date Taking? Authorizing Provider  allopurinol (ZYLOPRIM) 100 MG tablet Take 100 mg by mouth daily. Prescribed by Hepatologist (Dr. Jacqualine MauZacks) in [place].   Yes Historical Provider, MD  amLODipine (NORVASC) 10 MG tablet TAKE ONE TABLET BY MOUTH DAILY. 01/02/13  Yes Leona SingletonMaria T Thekkekandam, MD  losartan (COZAAR) 50 MG tablet Take 50 mg  by mouth daily.   Yes Historical Provider, MD  mycophenolate (CELLCEPT) 500 MG tablet Take 1,000 mg by mouth 2 (two) times daily.   Yes Historical Provider, MD  tacrolimus (PROGRAF) 1 MG capsule Take 9 mg by mouth 2 (two) times daily. Prescribed by Hepatologist (Dr. Jacqualine Mau) in [place].   Yes Historical Provider, MD  lisinopril (PRINIVIL,ZESTRIL) 10 MG tablet Take 1 tablet (10 mg total) by mouth daily. Patient not taking: Reported on 10/07/2014 01/03/13 01/03/14  Macy Mis, MD  loperamide (IMODIUM) 2 MG  capsule Take 1 capsule (2 mg total) by mouth 4 (four) times daily as needed for diarrhea or loose stools. 10/07/14   Finley Chevez A Forcucci, PA-C  ondansetron (ZOFRAN) 4 MG tablet Take 1 tablet (4 mg total) by mouth every 6 (six) hours. 10/07/14   Satoya Feeley A Forcucci, PA-C   BP 141/81 mmHg  Pulse 83  Temp(Src) 97.7 F (36.5 C) (Oral)  Resp 16  Ht 6\' 2"  (1.88 m)  Wt 260 lb (117.935 kg)  BMI 33.37 kg/m2  SpO2 96% Physical Exam  Constitutional: He is oriented to person, place, and time. He appears well-developed and well-nourished. No distress.  HENT:  Head: Normocephalic and atraumatic.  Mouth/Throat: Oropharynx is clear and moist. No oropharyngeal exudate.  Eyes: Conjunctivae and EOM are normal. Pupils are equal, round, and reactive to light. No scleral icterus.  Neck: Normal range of motion. Neck supple. No JVD present. No thyromegaly present.  Cardiovascular: Normal rate, regular rhythm, normal heart sounds and intact distal pulses.  Exam reveals no gallop and no friction rub.   No murmur heard. Pulmonary/Chest: Effort normal and breath sounds normal. No respiratory distress. He has no wheezes. He has no rales. He exhibits no tenderness.  Abdominal: Soft. Bowel sounds are normal. He exhibits no distension and no mass. There is no tenderness. There is no rebound and no guarding.  Multiple well-healed surgical incisions to the abdomen  Musculoskeletal: Normal range of motion.  Lymphadenopathy:    He has no cervical adenopathy.  Neurological: He is alert and oriented to person, place, and time.  Skin: Skin is warm and dry. He is not diaphoretic.  Psychiatric: He has a normal mood and affect. His behavior is normal. Judgment and thought content normal.  Nursing note and vitals reviewed.   ED Course  Procedures (including critical care time) Labs Review Labs Reviewed  CBC WITH DIFFERENTIAL - Abnormal; Notable for the following:    WBC 12.5 (*)    RBC 6.25 (*)    Hemoglobin 18.8 (*)     MCHC 37.6 (*)    Neutro Abs 9.2 (*)    Monocytes Absolute 1.2 (*)    All other components within normal limits  COMPREHENSIVE METABOLIC PANEL - Abnormal; Notable for the following:    Sodium 127 (*)    Chloride 94 (*)    CO2 13 (*)    Glucose, Bld 173 (*)    BUN 69 (*)    Creatinine, Ser 2.01 (*)    Total Protein 10.2 (*)    ALT 69 (*)    GFR calc non Af Amer 38 (*)    GFR calc Af Amer 44 (*)    Anion gap 20 (*)    All other components within normal limits  URINALYSIS, ROUTINE W REFLEX MICROSCOPIC - Abnormal; Notable for the following:    Color, Urine AMBER (*)    APPearance CLOUDY (*)    Hgb urine dipstick SMALL (*)    Protein,  ur 30 (*)    All other components within normal limits  URINE MICROSCOPIC-ADD ON - Abnormal; Notable for the following:    Bacteria, UA FEW (*)    Casts GRANULAR CAST (*)    Crystals CA OXALATE CRYSTALS (*)    All other components within normal limits  LIPASE, BLOOD    Imaging Review No results found.   EKG Interpretation None      MDM   Final diagnoses:  Nausea, vomiting, and diarrhea  Dehydration   Patient is a 48 year old male who presents to the emergency room for nausea vomiting and diarrhea. Physical exam reveals alert nontoxic-appearing male with no abdominal tenderness to palpation, no rigidity, no guarding, or any other peritoneal signs. There are multiple well-healed abdominal incisions. CBC reveals a concentrated hemoglobin. CMP reveals hyponatremia and hypochloremia. Serum creatinine is mildly elevated at 2.01. Suspect that the patient is severely dehydrated. Urine reveals no bacterial infection. Lipase is negative. I have given the patient 3 L of normal saline bolus here with great improvement of his symptoms. Patient has also received Zofran and is now tolerating by mouth fluids. Suspect that this is likely a viral gastrointestinal illness. Will discharge the patient home with Zofran and Imodium. Patient is to return for signs of  dehydration which we discussed here, abdominal pain, or any other concerning symptoms. Patient states understanding and agreement at this time. I would like for the patient follow up with his PCP in 2 days to ensure that he is continuing to improve. Patient states understanding and agreement at this time. I've discussed this patient with Dr. Lynelle DoctorKnapp who agrees with the above treatment and plan.  Eben Burowourtney A Forcucci, PA-C 10/07/14 1515  Ward GivensIva L Knapp, MD 10/07/14 [phone]

## 2014-10-07 NOTE — ED Notes (Signed)
Pt states that he has had N/V/d since Friday. States he is only able to "sip on fluids".

## 2014-10-07 NOTE — Discharge Instructions (Signed)
Dehydration, Adult °Dehydration is when you lose more fluids from the body than you take in. Vital organs like the kidneys, brain, and heart cannot function without a proper amount of fluids and salt. Any loss of fluids from the body can cause dehydration.  °CAUSES  °· Vomiting. °· Diarrhea. °· Excessive sweating. °· Excessive urine output. °· Fever. °SYMPTOMS  °Mild dehydration °· Thirst. °· Dry lips. °· Slightly dry mouth. °Moderate dehydration °· Very dry mouth. °· Sunken eyes. °· Skin does not bounce back quickly when lightly pinched and released. °· Dark urine and decreased urine production. °· Decreased tear production. °· Headache. °Severe dehydration °· Very dry mouth. °· Extreme thirst. °· Rapid, weak pulse (more than 100 beats per minute at rest). °· Cold hands and feet. °· Not able to sweat in spite of heat and temperature. °· Rapid breathing. °· Blue lips. °· Confusion and lethargy. °· Difficulty being awakened. °· Minimal urine production. °· No tears. °DIAGNOSIS  °Your caregiver will diagnose dehydration based on your symptoms and your exam. Blood and urine tests will help confirm the diagnosis. The diagnostic evaluation should also identify the cause of dehydration. °TREATMENT  °Treatment of mild or moderate dehydration can often be done at home by increasing the amount of fluids that you drink. It is best to drink small amounts of fluid more often. Drinking too much at one time can make vomiting worse. Refer to the home care instructions below. °Severe dehydration needs to be treated at the hospital where you will probably be given intravenous (IV) fluids that contain water and electrolytes. °HOME CARE INSTRUCTIONS  °· Ask your caregiver about specific rehydration instructions. °· Drink enough fluids to keep your urine clear or pale yellow. °· Drink small amounts frequently if you have nausea and vomiting. °· Eat as you normally do. °· Avoid: °¨ Foods or drinks high in sugar. °¨ Carbonated  drinks. °¨ Juice. °¨ Extremely hot or cold fluids. °¨ Drinks with caffeine. °¨ Fatty, greasy foods. °¨ Alcohol. °¨ Tobacco. °¨ Overeating. °¨ Gelatin desserts. °· Wash your hands well to avoid spreading bacteria and viruses. °· Only take over-the-counter or prescription medicines for pain, discomfort, or fever as directed by your caregiver. °· Ask your caregiver if you should continue all prescribed and over-the-counter medicines. °· Keep all follow-up appointments with your caregiver. °SEEK MEDICAL CARE IF: °· You have abdominal pain and it increases or stays in one area (localizes). °· You have a rash, stiff neck, or severe headache. °· You are irritable, sleepy, or difficult to awaken. °· You are weak, dizzy, or extremely thirsty. °SEEK IMMEDIATE MEDICAL CARE IF:  °· You are unable to keep fluids down or you get worse despite treatment. °· You have frequent episodes of vomiting or diarrhea. °· You have blood or green matter (bile) in your vomit. °· You have blood in your stool or your stool looks black and tarry. °· You have not urinated in 6 to 8 hours, or you have only urinated a small amount of very dark urine. °· You have a fever. °· You faint. °MAKE SURE YOU:  °· Understand these instructions. °· Will watch your condition. °· Will get help right away if you are not doing well or get worse. °Document Released: 10/24/2005 Document Revised: 01/16/2012 Document Reviewed: 06/13/2011 °[employer]® Patient Information ©2015 [employer]. This information is not intended to replace advice given to you by your health care provider. Make sure you discuss any questions you have with your health care   provider. ° °Diarrhea °Diarrhea is frequent loose and watery bowel movements. It can cause you to feel weak and dehydrated. Dehydration can cause you to become tired and thirsty, have a dry mouth, and have decreased urination that often is dark yellow. Diarrhea is a sign of another problem, most often an infection that will  not last long. In most cases, diarrhea typically lasts 2-3 days. However, it can last longer if it is a sign of something more serious. It is important to treat your diarrhea as directed by your caregiver to lessen or prevent future episodes of diarrhea. °CAUSES  °Some common causes include: °· Gastrointestinal infections caused by viruses, bacteria, or parasites. °· Food poisoning or food allergies. °· Certain medicines, such as antibiotics, chemotherapy, and laxatives. °· Artificial sweeteners and fructose. °· Digestive disorders. °HOME CARE INSTRUCTIONS °· Ensure adequate fluid intake (hydration): Have 1 cup (8 oz) of fluid for each diarrhea episode. Avoid fluids that contain simple sugars or sports drinks, fruit juices, whole milk products, and sodas. Your urine should be clear or pale yellow if you are drinking enough fluids. Hydrate with an oral rehydration solution that you can purchase at pharmacies, retail stores, and online. You can prepare an oral rehydration solution at home by mixing the following ingredients together: °¨  - tsp table salt. °¨ ¾ tsp baking soda. °¨  tsp salt substitute containing potassium chloride. °¨ 1  tablespoons sugar. °¨ 1 L (34 oz) of water. °· Certain foods and beverages may increase the speed at which food moves through the gastrointestinal (GI) tract. These foods and beverages should be avoided and include: °¨ Caffeinated and alcoholic beverages. °¨ High-fiber foods, such as raw fruits and vegetables, nuts, seeds, and whole grain breads and cereals. °¨ Foods and beverages sweetened with sugar alcohols, such as xylitol, sorbitol, and mannitol. °· Some foods may be well tolerated and may help thicken stool including: °¨ Starchy foods, such as rice, toast, pasta, low-sugar cereal, oatmeal, grits, baked potatoes, crackers, and bagels. °¨ Bananas. °¨ Applesauce. °· Add probiotic-rich foods to help increase healthy bacteria in the GI tract, such as yogurt and fermented milk  products. °· Wash your hands well after each diarrhea episode. °· Only take over-the-counter or prescription medicines as directed by your caregiver. °· Take a warm bath to relieve any burning or pain from frequent diarrhea episodes. °SEEK IMMEDIATE MEDICAL CARE IF:  °· You are unable to keep fluids down. °· You have persistent vomiting. °· You have blood in your stool, or your stools are black and tarry. °· You do not urinate in 6-8 hours, or there is only a small amount of very dark urine. °· You have abdominal pain that increases or localizes. °· You have weakness, dizziness, confusion, or light-headedness. °· You have a severe headache. °· Your diarrhea gets worse or does not get better. °· You have a fever or persistent symptoms for more than 2-3 days. °· You have a fever and your symptoms suddenly get worse. °MAKE SURE YOU:  °· Understand these instructions. °· Will watch your condition. °· Will get help right away if you are not doing well or get worse. °Document Released: 10/14/2002 Document Revised: 03/10/2014 Document Reviewed: 07/01/2012 °[employer]® Patient Information ©2015 [employer]. This information is not intended to replace advice given to you by your health care provider. Make sure you discuss any questions you have with your health care provider. ° °Nausea and Vomiting °Nausea is a sick feeling that often comes before throwing   up (vomiting). Vomiting is a reflex where stomach contents come out of your mouth. Vomiting can cause severe loss of body fluids (dehydration). Children and elderly adults can become dehydrated quickly, especially if they also have diarrhea. Nausea and vomiting are symptoms of a condition or disease. It is important to find the cause of your symptoms. °CAUSES  °· Direct irritation of the stomach lining. This irritation can result from increased acid production (gastroesophageal reflux disease), infection, food poisoning, taking certain medicines (such as nonsteroidal  anti-inflammatory drugs), alcohol use, or tobacco use. °· Signals from the brain. These signals could be caused by a headache, heat exposure, an inner ear disturbance, increased pressure in the brain from injury, infection, a tumor, or a concussion, pain, emotional stimulus, or metabolic problems. °· An obstruction in the gastrointestinal tract (bowel obstruction). °· Illnesses such as diabetes, hepatitis, gallbladder problems, appendicitis, kidney problems, cancer, sepsis, atypical symptoms of a heart attack, or eating disorders. °· Medical treatments such as chemotherapy and radiation. °· Receiving medicine that makes you sleep (general anesthetic) during surgery. °DIAGNOSIS °Your caregiver may ask for tests to be done if the problems do not improve after a few days. Tests may also be done if symptoms are severe or if the reason for the nausea and vomiting is not clear. Tests may include: °· Urine tests. °· Blood tests. °· Stool tests. °· Cultures (to look for evidence of infection). °· X-rays or other imaging studies. °Test results can help your caregiver make decisions about treatment or the need for additional tests. °TREATMENT °You need to stay well hydrated. Drink frequently but in small amounts. You may wish to drink water, sports drinks, clear broth, or eat frozen ice pops or gelatin dessert to help stay hydrated. When you eat, eating slowly may help prevent nausea. There are also some antinausea medicines that may help prevent nausea. °HOME CARE INSTRUCTIONS  °· Take all medicine as directed by your caregiver. °· If you do not have an appetite, do not force yourself to eat. However, you must continue to drink fluids. °· If you have an appetite, eat a normal diet unless your caregiver tells you differently. °¨ Eat a variety of complex carbohydrates (rice, wheat, potatoes, bread), lean meats, yogurt, fruits, and vegetables. °¨ Avoid high-fat foods because they are more difficult to digest. °· Drink enough  water and fluids to keep your urine clear or pale yellow. °· If you are dehydrated, ask your caregiver for specific rehydration instructions. Signs of dehydration may include: °¨ Severe thirst. °¨ Dry lips and mouth. °¨ Dizziness. °¨ Dark urine. °¨ Decreasing urine frequency and amount. °¨ Confusion. °¨ Rapid breathing or pulse. °SEEK IMMEDIATE MEDICAL CARE IF:  °· You have blood or brown flecks (like coffee grounds) in your vomit. °· You have black or bloody stools. °· You have a severe headache or stiff neck. °· You are confused. °· You have severe abdominal pain. °· You have chest pain or trouble breathing. °· You do not urinate at least once every 8 hours. °· You develop cold or clammy skin. °· You continue to vomit for longer than 24 to 48 hours. °· You have a fever. °MAKE SURE YOU:  °· Understand these instructions. °· Will watch your condition. °· Will get help right away if you are not doing well or get worse. °Document Released: 10/24/2005 Document Revised: 01/16/2012 Document Reviewed: 03/23/2011 °[employer]® Patient Information ©2015 [employer]. This information is not intended to replace advice given to you by your health care provider. Make   sure you discuss any questions you have with your health care provider. ° °

## 2014-10-07 NOTE — ED Notes (Signed)
PA at bedside.

## 2014-10-07 NOTE — ED Notes (Signed)
Patient ambulated PA notified

## 2016-12-22 DIAGNOSIS — M545 Low back pain: Secondary | ICD-10-CM | POA: Diagnosis not present

## 2017-01-24 DIAGNOSIS — Z125 Encounter for screening for malignant neoplasm of prostate: Secondary | ICD-10-CM | POA: Diagnosis not present

## 2017-01-24 DIAGNOSIS — Z0000 Encounter for general adult medical examination without abnormal findings: Secondary | ICD-10-CM | POA: Diagnosis not present

## 2017-01-24 DIAGNOSIS — R7303 Prediabetes: Secondary | ICD-10-CM | POA: Diagnosis not present

## 2017-01-24 DIAGNOSIS — I10 Essential (primary) hypertension: Secondary | ICD-10-CM | POA: Diagnosis not present

## 2017-01-24 DIAGNOSIS — E559 Vitamin D deficiency, unspecified: Secondary | ICD-10-CM | POA: Diagnosis not present

## 2017-01-24 DIAGNOSIS — M109 Gout, unspecified: Secondary | ICD-10-CM | POA: Diagnosis not present

## 2017-02-09 DIAGNOSIS — Z23 Encounter for immunization: Secondary | ICD-10-CM | POA: Diagnosis not present

## 2017-03-14 DIAGNOSIS — Z23 Encounter for immunization: Secondary | ICD-10-CM | POA: Diagnosis not present

## 2017-03-14 DIAGNOSIS — R161 Splenomegaly, not elsewhere classified: Secondary | ICD-10-CM | POA: Diagnosis not present

## 2017-03-14 DIAGNOSIS — R938 Abnormal findings on diagnostic imaging of other specified body structures: Secondary | ICD-10-CM | POA: Diagnosis not present

## 2017-03-14 DIAGNOSIS — Z944 Liver transplant status: Secondary | ICD-10-CM | POA: Diagnosis not present

## 2017-03-14 DIAGNOSIS — Z4823 Encounter for aftercare following liver transplant: Secondary | ICD-10-CM | POA: Diagnosis not present

## 2017-04-13 DIAGNOSIS — Z23 Encounter for immunization: Secondary | ICD-10-CM | POA: Diagnosis not present

## 2017-07-27 DIAGNOSIS — E559 Vitamin D deficiency, unspecified: Secondary | ICD-10-CM | POA: Diagnosis not present

## 2017-07-27 DIAGNOSIS — Z23 Encounter for immunization: Secondary | ICD-10-CM | POA: Diagnosis not present

## 2017-07-27 DIAGNOSIS — R7303 Prediabetes: Secondary | ICD-10-CM | POA: Diagnosis not present

## 2017-07-27 DIAGNOSIS — M109 Gout, unspecified: Secondary | ICD-10-CM | POA: Diagnosis not present

## 2017-07-27 DIAGNOSIS — K219 Gastro-esophageal reflux disease without esophagitis: Secondary | ICD-10-CM | POA: Diagnosis not present

## 2017-07-27 DIAGNOSIS — I10 Essential (primary) hypertension: Secondary | ICD-10-CM | POA: Diagnosis not present

## 2017-08-28 DIAGNOSIS — M8588 Other specified disorders of bone density and structure, other site: Secondary | ICD-10-CM | POA: Diagnosis not present

## 2017-08-28 DIAGNOSIS — I10 Essential (primary) hypertension: Secondary | ICD-10-CM | POA: Diagnosis not present

## 2017-09-26 DIAGNOSIS — I10 Essential (primary) hypertension: Secondary | ICD-10-CM | POA: Diagnosis not present

## 2018-03-07 MED ORDER — PROGRAF 1 MG CAPSULE
ORAL_CAPSULE | Freq: Two times a day (BID) | ORAL | 3 refills | 0.00000 days | Status: CP
Start: 2018-03-07 — End: 2018-03-27

## 2018-03-07 MED ORDER — CELLCEPT 500 MG TABLET
ORAL_TABLET | Freq: Two times a day (BID) | ORAL | 3 refills | 0 days | Status: CP
Start: 2018-03-07 — End: 2018-03-27

## 2018-03-26 DIAGNOSIS — K5190 Ulcerative colitis, unspecified, without complications: Secondary | ICD-10-CM | POA: Diagnosis not present

## 2018-03-26 DIAGNOSIS — I10 Essential (primary) hypertension: Secondary | ICD-10-CM | POA: Diagnosis not present

## 2018-03-26 DIAGNOSIS — R7303 Prediabetes: Secondary | ICD-10-CM | POA: Diagnosis not present

## 2018-03-26 DIAGNOSIS — E559 Vitamin D deficiency, unspecified: Secondary | ICD-10-CM | POA: Diagnosis not present

## 2018-03-26 DIAGNOSIS — Z944 Liver transplant status: Secondary | ICD-10-CM | POA: Diagnosis not present

## 2018-03-27 ENCOUNTER — Ambulatory Visit: Admit: 2018-03-27 | Discharge: 2018-03-28 | Payer: PRIVATE HEALTH INSURANCE

## 2018-03-27 ENCOUNTER — Ambulatory Visit
Admit: 2018-03-27 | Discharge: 2018-03-28 | Payer: PRIVATE HEALTH INSURANCE | Attending: Gastroenterology | Primary: Gastroenterology

## 2018-03-27 DIAGNOSIS — R161 Splenomegaly, not elsewhere classified: Secondary | ICD-10-CM | POA: Diagnosis not present

## 2018-03-27 DIAGNOSIS — Z9889 Other specified postprocedural states: Secondary | ICD-10-CM | POA: Diagnosis not present

## 2018-03-27 DIAGNOSIS — Z944 Liver transplant status: Secondary | ICD-10-CM | POA: Diagnosis not present

## 2018-03-27 DIAGNOSIS — K5190 Ulcerative colitis, unspecified, without complications: Secondary | ICD-10-CM | POA: Diagnosis not present

## 2018-03-27 DIAGNOSIS — Z23 Encounter for immunization: Secondary | ICD-10-CM | POA: Diagnosis not present

## 2018-03-27 DIAGNOSIS — Z79899 Other long term (current) drug therapy: Secondary | ICD-10-CM | POA: Diagnosis not present

## 2018-03-27 MED ORDER — CELLCEPT 500 MG TABLET
ORAL_TABLET | Freq: Two times a day (BID) | ORAL | 3 refills | 0.00000 days | Status: CP
Start: 2018-03-27 — End: 2019-04-10

## 2018-03-27 MED ORDER — PROGRAF 1 MG CAPSULE
ORAL_CAPSULE | Freq: Two times a day (BID) | ORAL | 3 refills | 0.00000 days | Status: CP
Start: 2018-03-27 — End: 2019-04-10

## 2018-09-26 DIAGNOSIS — Z0000 Encounter for general adult medical examination without abnormal findings: Secondary | ICD-10-CM | POA: Diagnosis not present

## 2018-09-26 DIAGNOSIS — I10 Essential (primary) hypertension: Secondary | ICD-10-CM | POA: Diagnosis not present

## 2018-09-26 DIAGNOSIS — R7303 Prediabetes: Secondary | ICD-10-CM | POA: Diagnosis not present

## 2018-09-26 DIAGNOSIS — M109 Gout, unspecified: Secondary | ICD-10-CM | POA: Diagnosis not present

## 2018-09-26 DIAGNOSIS — E559 Vitamin D deficiency, unspecified: Secondary | ICD-10-CM | POA: Diagnosis not present

## 2019-03-27 DIAGNOSIS — I10 Essential (primary) hypertension: Secondary | ICD-10-CM | POA: Diagnosis not present

## 2019-03-27 DIAGNOSIS — Z944 Liver transplant status: Secondary | ICD-10-CM | POA: Diagnosis not present

## 2019-03-27 DIAGNOSIS — R7303 Prediabetes: Secondary | ICD-10-CM | POA: Diagnosis not present

## 2019-04-10 MED ORDER — PROGRAF 1 MG CAPSULE
ORAL_CAPSULE | Freq: Two times a day (BID) | ORAL | 3 refills | 0 days | Status: CP
Start: 2019-04-10 — End: ?

## 2019-04-10 MED ORDER — CELLCEPT 500 MG TABLET
ORAL_TABLET | Freq: Two times a day (BID) | ORAL | 3 refills | 0 days | Status: CP
Start: 2019-04-10 — End: ?

## 2019-07-18 ENCOUNTER — Ambulatory Visit: Admit: 2019-07-18 | Discharge: 2019-07-19 | Payer: PRIVATE HEALTH INSURANCE

## 2019-07-18 DIAGNOSIS — Z944 Liver transplant status: Secondary | ICD-10-CM

## 2019-07-18 DIAGNOSIS — Z79899 Other long term (current) drug therapy: Secondary | ICD-10-CM

## 2019-07-18 DIAGNOSIS — Z4823 Encounter for aftercare following liver transplant: Secondary | ICD-10-CM

## 2019-08-07 ENCOUNTER — Telehealth
Admit: 2019-08-07 | Discharge: 2019-08-08 | Payer: PRIVATE HEALTH INSURANCE | Attending: Gastroenterology | Primary: Gastroenterology

## 2019-08-07 DIAGNOSIS — K5190 Ulcerative colitis, unspecified, without complications: Secondary | ICD-10-CM

## 2019-08-07 DIAGNOSIS — K8301 Primary sclerosing cholangitis: Secondary | ICD-10-CM

## 2019-08-07 DIAGNOSIS — Z944 Liver transplant status: Secondary | ICD-10-CM

## 2019-08-15 DIAGNOSIS — Z944 Liver transplant status: Secondary | ICD-10-CM

## 2019-08-15 DIAGNOSIS — Z79899 Other long term (current) drug therapy: Secondary | ICD-10-CM

## 2019-08-15 MED ORDER — CELLCEPT 500 MG TABLET
ORAL_TABLET | Freq: Two times a day (BID) | ORAL | 3 refills | 90.00000 days | Status: CP
Start: 2019-08-15 — End: ?

## 2020-01-20 DIAGNOSIS — Z944 Liver transplant status: Principal | ICD-10-CM

## 2020-01-20 DIAGNOSIS — Z79899 Other long term (current) drug therapy: Principal | ICD-10-CM

## 2020-03-20 ENCOUNTER — Ambulatory Visit: Admit: 2020-03-20 | Discharge: 2020-03-21 | Payer: PRIVATE HEALTH INSURANCE

## 2020-03-24 ENCOUNTER — Telehealth
Admit: 2020-03-24 | Discharge: 2020-03-25 | Payer: PRIVATE HEALTH INSURANCE | Attending: Gastroenterology | Primary: Gastroenterology

## 2020-04-10 DIAGNOSIS — Z944 Liver transplant status: Principal | ICD-10-CM

## 2020-04-10 DIAGNOSIS — Z79899 Other long term (current) drug therapy: Principal | ICD-10-CM

## 2020-04-10 MED ORDER — PROGRAF 1 MG CAPSULE
ORAL_CAPSULE | Freq: Two times a day (BID) | ORAL | 3 refills | 90 days | Status: CP
Start: 2020-04-10 — End: ?

## 2020-09-01 DIAGNOSIS — Z79899 Other long term (current) drug therapy: Principal | ICD-10-CM

## 2020-09-01 DIAGNOSIS — R7989 Other specified abnormal findings of blood chemistry: Principal | ICD-10-CM

## 2020-09-01 DIAGNOSIS — Z944 Liver transplant status: Principal | ICD-10-CM

## 2020-09-01 MED ORDER — PREDNISONE 10 MG TABLET
ORAL_TABLET | 0 refills | 0 days | Status: CP
Start: 2020-09-01 — End: ?

## 2020-09-01 MED ORDER — PROGRAF 1 MG CAPSULE
ORAL_CAPSULE | Freq: Two times a day (BID) | ORAL | 3 refills | 90 days | Status: CP
Start: 2020-09-01 — End: ?

## 2020-09-01 MED ORDER — CELLCEPT 500 MG TABLET
ORAL_TABLET | Freq: Two times a day (BID) | ORAL | 3 refills | 90 days | Status: CP
Start: 2020-09-01 — End: ?

## 2020-09-10 ENCOUNTER — Ambulatory Visit: Admit: 2020-09-10 | Discharge: 2020-09-11 | Payer: PRIVATE HEALTH INSURANCE

## 2020-09-10 DIAGNOSIS — Z79899 Other long term (current) drug therapy: Principal | ICD-10-CM

## 2020-09-10 DIAGNOSIS — Z944 Liver transplant status: Principal | ICD-10-CM

## 2020-09-10 DIAGNOSIS — R7989 Other specified abnormal findings of blood chemistry: Principal | ICD-10-CM

## 2020-09-22 MED ORDER — PREDNISONE 5 MG TABLET
ORAL_TABLET | Freq: Every day | ORAL | 0 refills | 30 days | Status: CP
Start: 2020-09-22 — End: 2020-10-27

## 2020-09-28 DIAGNOSIS — Z944 Liver transplant status: Principal | ICD-10-CM

## 2020-10-15 DIAGNOSIS — Z79899 Other long term (current) drug therapy: Principal | ICD-10-CM

## 2020-10-15 DIAGNOSIS — Z944 Liver transplant status: Principal | ICD-10-CM

## 2020-10-15 MED ORDER — MYFORTIC 360 MG TABLET,DELAYED RELEASE
ORAL_TABLET | Freq: Two times a day (BID) | ORAL | 3 refills | 90 days | Status: CP
Start: 2020-10-15 — End: ?

## 2020-10-16 DIAGNOSIS — Z944 Liver transplant status: Principal | ICD-10-CM

## 2020-10-27 ENCOUNTER — Inpatient Hospital Stay (HOSPITAL_COMMUNITY)
Admission: EM | Admit: 2020-10-27 | Discharge: 2020-10-29 | DRG: 864 | Disposition: A | Discharge status: Home / Self Care | Payer: Commercial Managed Care - PPO | Attending: Internal Medicine | Admitting: Internal Medicine

## 2020-10-27 ENCOUNTER — Other Ambulatory Visit: Payer: Self-pay

## 2020-10-27 ENCOUNTER — Emergency Department (HOSPITAL_COMMUNITY): Payer: Commercial Managed Care - PPO

## 2020-10-27 ENCOUNTER — Encounter (HOSPITAL_COMMUNITY): Payer: Self-pay

## 2020-10-27 DIAGNOSIS — E1165 Type 2 diabetes mellitus with hyperglycemia: Secondary | ICD-10-CM | POA: Diagnosis present

## 2020-10-27 DIAGNOSIS — I10 Essential (primary) hypertension: Secondary | ICD-10-CM | POA: Diagnosis present

## 2020-10-27 DIAGNOSIS — Z7984 Long term (current) use of oral hypoglycemic drugs: Secondary | ICD-10-CM

## 2020-10-27 DIAGNOSIS — M109 Gout, unspecified: Secondary | ICD-10-CM | POA: Diagnosis present

## 2020-10-27 DIAGNOSIS — D849 Immunodeficiency, unspecified: Secondary | ICD-10-CM | POA: Diagnosis present

## 2020-10-27 DIAGNOSIS — Z79899 Other long term (current) drug therapy: Secondary | ICD-10-CM

## 2020-10-27 DIAGNOSIS — E871 Hypo-osmolality and hyponatremia: Secondary | ICD-10-CM | POA: Diagnosis present

## 2020-10-27 DIAGNOSIS — R7309 Other abnormal glucose: Secondary | ICD-10-CM | POA: Diagnosis not present

## 2020-10-27 DIAGNOSIS — Z7982 Long term (current) use of aspirin: Secondary | ICD-10-CM

## 2020-10-27 DIAGNOSIS — Z8249 Family history of ischemic heart disease and other diseases of the circulatory system: Secondary | ICD-10-CM

## 2020-10-27 DIAGNOSIS — R509 Fever, unspecified: Principal | ICD-10-CM | POA: Diagnosis present

## 2020-10-27 DIAGNOSIS — Z944 Liver transplant status: Secondary | ICD-10-CM

## 2020-10-27 DIAGNOSIS — Z20822 Contact with and (suspected) exposure to covid-19: Secondary | ICD-10-CM | POA: Diagnosis present

## 2020-10-27 DIAGNOSIS — D696 Thrombocytopenia, unspecified: Secondary | ICD-10-CM | POA: Diagnosis present

## 2020-10-27 DIAGNOSIS — Z9049 Acquired absence of other specified parts of digestive tract: Secondary | ICD-10-CM

## 2020-10-27 DIAGNOSIS — Z888 Allergy status to other drugs, medicaments and biological substances status: Secondary | ICD-10-CM

## 2020-10-27 DIAGNOSIS — Z833 Family history of diabetes mellitus: Secondary | ICD-10-CM

## 2020-10-27 LAB — CBC WITH DIFFERENTIAL/PLATELET
Abs Immature Granulocytes: 0.03 10*3/uL (ref 0.00–0.07)
Basophils Absolute: 0 10*3/uL (ref 0.0–0.1)
Basophils Relative: 0 %
Eosinophils Absolute: 0 10*3/uL (ref 0.0–0.5)
Eosinophils Relative: 0 %
HCT: 45.2 % (ref 39.0–52.0)
Hemoglobin: 15.6 g/dL (ref 13.0–17.0)
Immature Granulocytes: 0 %
Lymphocytes Relative: 7 %
Lymphs Abs: 0.5 10*3/uL — ABNORMAL LOW (ref 0.7–4.0)
MCH: 29.5 pg (ref 26.0–34.0)
MCHC: 34.5 g/dL (ref 30.0–36.0)
MCV: 85.6 fL (ref 80.0–100.0)
Monocytes Absolute: 0.6 10*3/uL (ref 0.1–1.0)
Monocytes Relative: 9 %
Neutro Abs: 5.9 10*3/uL (ref 1.7–7.7)
Neutrophils Relative %: 84 %
Platelets: 134 10*3/uL — ABNORMAL LOW (ref 150–400)
RBC: 5.28 MIL/uL (ref 4.22–5.81)
RDW: 13.1 % (ref 11.5–15.5)
WBC: 7 10*3/uL (ref 4.0–10.5)
nRBC: 0 % (ref 0.0–0.2)

## 2020-10-27 LAB — COMPREHENSIVE METABOLIC PANEL
ALT: 63 U/L — ABNORMAL HIGH (ref 0–44)
AST: 55 U/L — ABNORMAL HIGH (ref 15–41)
Albumin: 3.9 g/dL (ref 3.5–5.0)
Alkaline Phosphatase: 50 U/L (ref 38–126)
Anion gap: 11 (ref 5–15)
BUN: 15 mg/dL (ref 6–20)
CO2: 22 mmol/L (ref 22–32)
Calcium: 9.4 mg/dL (ref 8.9–10.3)
Chloride: 101 mmol/L (ref 98–111)
Creatinine, Ser: 1.08 mg/dL (ref 0.61–1.24)
GFR, Estimated: >60 mL/min (ref >60)
Glucose, Bld: 195 mg/dL — ABNORMAL HIGH (ref 70–99)
Potassium: 3.8 mmol/L (ref 3.5–5.1)
Sodium: 134 mmol/L — ABNORMAL LOW (ref 135–145)
Total Bilirubin: 1.4 mg/dL — ABNORMAL HIGH (ref 0.3–1.2)
Total Protein: 8 g/dL (ref 6.5–8.1)

## 2020-10-27 LAB — URINALYSIS, ROUTINE W REFLEX MICROSCOPIC
Bilirubin Urine: NEGATIVE
Glucose, UA: NEGATIVE mg/dL
Ketones, ur: NEGATIVE mg/dL
Leukocytes,Ua: NEGATIVE
Nitrite: NEGATIVE
Protein, ur: NEGATIVE mg/dL
Specific Gravity, Urine: 1.005 (ref 1.005–1.030)
pH: 5 (ref 5.0–8.0)

## 2020-10-27 LAB — HEMOGLOBIN A1C
Hgb A1c MFr Bld: 7.4 % — ABNORMAL HIGH (ref 4.8–5.6)
Mean Plasma Glucose: 165.68 mg/dL

## 2020-10-27 LAB — RESP PANEL BY RT-PCR (FLU A&B, COVID) ARPGX2
Influenza A by PCR: NEGATIVE
Influenza B by PCR: NEGATIVE
SARS Coronavirus 2 by RT PCR: NEGATIVE

## 2020-10-27 LAB — PROTIME-INR
INR: 1.2 (ref 0.8–1.2)
Prothrombin Time: 14.2 seconds (ref 11.4–15.2)

## 2020-10-27 LAB — GLUCOSE, CAPILLARY: Glucose-Capillary: 202 mg/dL — ABNORMAL HIGH (ref 70–99)

## 2020-10-27 LAB — HIV ANTIBODY (ROUTINE TESTING W REFLEX): HIV Screen 4th Generation wRfx: NONREACTIVE

## 2020-10-27 LAB — LACTIC ACID, PLASMA: Lactic Acid, Venous: 1.8 mmol/L (ref 0.5–1.9)

## 2020-10-27 LAB — APTT: aPTT: 30 seconds (ref 24–36)

## 2020-10-27 MED ORDER — VANCOMYCIN HCL 2000 MG/400ML IV SOLN
2000.0000 mg | Freq: Once | INTRAVENOUS | Status: AC
  Administered 2020-10-27: 11:00:00 2000 mg via INTRAVENOUS
  Filled 2020-10-27: qty 400

## 2020-10-27 MED ORDER — SODIUM CHLORIDE 0.9 % IV SOLN: 2.0000 g | Freq: Once | INTRAVENOUS | Status: DC

## 2020-10-27 MED ORDER — ACETAMINOPHEN 650 MG RE SUPP: 650.0000 mg | Freq: Four times a day (QID) | RECTAL | Status: DC | PRN

## 2020-10-27 MED ORDER — METRONIDAZOLE 500 MG PO TABS
500.0000 mg | ORAL_TABLET | Freq: Three times a day (TID) | ORAL | Status: DC
  Administered 2020-10-27 – 2020-10-29 (×5): 500 mg via ORAL
  Filled 2020-10-27 (×5): qty 1

## 2020-10-27 MED ORDER — HYDROCODONE-ACETAMINOPHEN 5-325 MG PO TABS: 1.0000 | ORAL_TABLET | ORAL | Status: DC | PRN

## 2020-10-27 MED ORDER — LACTATED RINGERS IV SOLN: INTRAVENOUS | Status: DC

## 2020-10-27 MED ORDER — LACTATED RINGERS IV BOLUS (SEPSIS)
1000.0000 mL | Freq: Once | INTRAVENOUS | Status: AC
  Administered 2020-10-27: 1000 mL via INTRAVENOUS

## 2020-10-27 MED ORDER — POLYETHYLENE GLYCOL 3350 17 G PO PACK: 17.0000 g | PACK | Freq: Every day | ORAL | Status: DC | PRN

## 2020-10-27 MED ORDER — VANCOMYCIN HCL 750 MG/150ML IV SOLN
750.0000 mg | Freq: Two times a day (BID) | INTRAVENOUS | Status: DC
  Administered 2020-10-27 – 2020-10-28 (×3): 750 mg via INTRAVENOUS
  Filled 2020-10-27 (×4): qty 150

## 2020-10-27 MED ORDER — SODIUM CHLORIDE 0.9 % IV SOLN
2.0000 g | Freq: Three times a day (TID) | INTRAVENOUS | Status: DC
  Administered 2020-10-27 – 2020-10-29 (×6): 2 g via INTRAVENOUS
  Filled 2020-10-27 (×6): qty 2

## 2020-10-27 MED ORDER — VANCOMYCIN HCL IN DEXTROSE 1-5 GM/200ML-% IV SOLN: 1000.0000 mg | Freq: Once | INTRAVENOUS | Status: DC

## 2020-10-27 MED ORDER — METRONIDAZOLE 500 MG PO TABS
500.0000 mg | ORAL_TABLET | Freq: Three times a day (TID) | ORAL | Status: DC
  Administered 2020-10-27: 10:00:00 500 mg via ORAL
  Filled 2020-10-27: qty 1

## 2020-10-27 MED ORDER — ACETAMINOPHEN 325 MG PO TABS
650.0000 mg | ORAL_TABLET | Freq: Once | ORAL | Status: AC | PRN
  Administered 2020-10-27: 650 mg via ORAL
  Filled 2020-10-27: qty 2

## 2020-10-27 MED ORDER — SODIUM CHLORIDE 0.9 % IV SOLN
2.0000 g | Freq: Once | INTRAVENOUS | Status: AC
  Administered 2020-10-27: 10:00:00 2 g via INTRAVENOUS
  Filled 2020-10-27: qty 2

## 2020-10-27 MED ORDER — ENOXAPARIN SODIUM 40 MG/0.4ML ~~LOC~~ SOLN
40.0000 mg | SUBCUTANEOUS | Status: DC
  Administered 2020-10-27 – 2020-10-28 (×2): 40 mg via SUBCUTANEOUS
  Filled 2020-10-27 (×2): qty 0.4

## 2020-10-27 MED ORDER — ACETAMINOPHEN 325 MG PO TABS: 650.0000 mg | ORAL_TABLET | Freq: Four times a day (QID) | ORAL | Status: DC | PRN

## 2020-10-27 MED ORDER — METOPROLOL SUCCINATE ER 50 MG PO TB24
50.0000 mg | ORAL_TABLET | Freq: Every day | ORAL | Status: DC
  Administered 2020-10-27 – 2020-10-29 (×3): 50 mg via ORAL
  Filled 2020-10-27 (×3): qty 1

## 2020-10-27 MED ORDER — INSULIN ASPART 100 UNIT/ML ~~LOC~~ SOLN
0.0000 [IU] | Freq: Three times a day (TID) | SUBCUTANEOUS | Status: DC
  Administered 2020-10-28: 10:00:00 1 [IU] via SUBCUTANEOUS
  Administered 2020-10-28 (×2): 2 [IU] via SUBCUTANEOUS
  Administered 2020-10-29: 09:00:00 3 [IU] via SUBCUTANEOUS

## 2020-10-27 MED ORDER — DOXAZOSIN MESYLATE 2 MG PO TABS
2.0000 mg | ORAL_TABLET | Freq: Every morning | ORAL | Status: DC
Start: 2020-10-28 — End: 2020-10-27

## 2020-10-27 MED ORDER — ASPIRIN EC 325 MG PO TBEC: 325.0000 mg | DELAYED_RELEASE_TABLET | Freq: Four times a day (QID) | ORAL | Status: DC | PRN

## 2020-10-27 MED ORDER — PREDNISONE 5 MG TABLET
ORAL_TABLET | Freq: Every day | ORAL | 11 refills | 30 days | Status: CP
Start: 2020-10-27 — End: ?

## 2020-10-27 NOTE — ED Provider Notes (Signed)
[facility]-EMERGENCY DEPT Provider Note   CSN: [id number] Arrival date & time: 10/27/20  3614     History Chief Complaint  Patient presents with  . Fever  . Fatigue    Timothy Riggs is a 54 y.o. male.  Patient is a 54 year old male with a history of nonalcoholic cirrhosis status post liver transplant in 2006 currently on antirejection medication and hypertension who is presenting today with complaints of fatigue and feeling lightheaded with standing.  Patient reports that yesterday he felt like his normal self.  He was eating and drinking normally but did have some diarrhea.  He reports the diarrhea is not typically unusual as he gets it quite frequently.  This morning when he woke up however he did not feel well.  When he arrived at the hospital he had a temperature of 100.8 and reports that he was not aware that he had a fever until he arrived.  He was given Tylenol upon his arrival and reports now he starting to feel better.  He has had no nausea or vomiting.  He denies any dysuria or abdominal pain.  He has received his Covid vaccine but has not received the booster.  He has had no known sick contacts.  He does report that he is liver numbers were elevated and he was on a course of prednisone which was being tapered down and he finished the course last week.  The history is provided by the patient.  Fever Max temp prior to arrival:  100.8 Temp source:  Oral Severity:  Moderate Onset quality:  Sudden Duration: Noticed he was not feeling well when he woke up this morning. Timing:  Constant Progression:  Improving Chronicity:  New Relieved by:  Acetaminophen Worsened by:  Nothing Ineffective treatments:  None tried Associated symptoms: diarrhea   Associated symptoms: no chest pain, no chills, no congestion, no cough, no dysuria, no headaches, no nausea, no rhinorrhea and no vomiting   Associated symptoms comment:  Fatigue and feeling dizzy with  standing      Past Medical History:  Diagnosis Date  . Gout attack 2012   Elevated uric acid level at [facility].   Marland Kitchen Hypertension   . Non-alcoholic cirrhosis (HCC) 2000   s/p transplant of liver in 2006  . Sclerosing cholangitis   . Ulcerative colitis 1990   s/p colectomy    Patient Active Problem List   Diagnosis Date Noted  . Nausea, vomiting, and diarrhea 01/01/2013  . Gout 11/28/2011  . Gout attack   . PREDIABETES 04/16/2008  . OBESITY 02/07/2008  . Essential hypertension, benign 12/04/2007  . ULCERATIVE COLITIS 12/04/2007  . Liver replaced by transplant (HCC) 12/04/2007    Past Surgical History:  Procedure Laterality Date  . LIVER TRANSPLANT  [address]  . TOTAL COLECTOMY  07/2011 and    for ulcerative colitis, ostomy x 1 mos, reversal in Nov 2012       Family History  Problem Relation Age of Onset  . Ulcerative colitis Brother   . Cirrhosis Brother        non- alcoholic   . Diabetes Mother   . Diabetes Father   . Heart disease Father 51       s/p triple bypass in 2005    Social History   Tobacco Use  . Smoking status: Never Smoker  . Smokeless tobacco: Never Used  Vaping Use  . Vaping Use: Never used  Substance Use Topics  . Alcohol  use: No  . Drug use: No    Home Medications Prior to Admission medications   Medication Sig Start Date End Date Taking? Authorizing Provider  allopurinol (ZYLOPRIM) 100 MG tablet Take 200 mg by mouth daily. Prescribed by Hepatologist (Dr. Jacqualine Mau) in [place].   Yes [provider]  doxazosin (CARDURA) 2 MG tablet Take 2 mg by mouth in the morning. 10/10/20  Yes [provider]  metFORMIN (GLUCOPHAGE-XR) 500 MG 24 hr tablet Take 1,000 mg by mouth daily with breakfast. 08/13/20  Yes [provider]  metoprolol succinate (TOPROL-XL) 50 MG 24 hr tablet Take 50 mg by mouth daily. 10/23/20  Yes [provider]  mycophenolate (CELLCEPT) 500 MG tablet Take 500 mg by mouth 2 (two) times  daily.   Yes [provider]  tacrolimus (PROGRAF) 1 MG capsule Take 9 mg by mouth 2 (two) times daily. Prescribed by Hepatologist (Dr. Jacqualine Mau) in [place].   Yes [provider]  amLODipine (NORVASC) 10 MG tablet TAKE ONE TABLET BY MOUTH DAILY. Patient not taking: Reported on 10/27/2020 01/02/13   Leona Singleton, MD  aspirin EC 325 MG tablet Take 325 mg by mouth every 6 (six) hours as needed (headache).    [provider]  lisinopril (PRINIVIL,ZESTRIL) 10 MG tablet Take 1 tablet (10 mg total) by mouth daily. 01/03/13 01/03/14  Macy Mis, MD  loperamide (IMODIUM) 2 MG capsule Take 1 capsule (2 mg total) by mouth 4 (four) times daily as needed for diarrhea or loose stools. Patient not taking: No sig reported 10/07/14   Shirleen Schirmer, PA-C  ondansetron (ZOFRAN) 4 MG tablet Take 1 tablet (4 mg total) by mouth every 6 (six) hours. Patient not taking: No sig reported 10/07/14   Shirleen Schirmer, PA-C    Allergies    Lisinopril  Review of Systems   Review of Systems  Constitutional: Positive for fever. Negative for chills.  HENT: Negative for congestion and rhinorrhea.   Respiratory: Negative for cough.   Cardiovascular: Negative for chest pain.  Gastrointestinal: Positive for diarrhea. Negative for nausea and vomiting.  Genitourinary: Negative for dysuria.  Neurological: Negative for headaches.  All other systems reviewed and are negative.   Physical Exam Updated Vital Signs BP (!) 168/92 (BP Location: Left Arm)   Pulse 87   Temp (!) 100.8 F (38.2 C) (Oral)   Resp 16   Ht 6\' 2"  (1.88 m)   Wt 108.9 kg   SpO2 94%   BMI 30.81 kg/m   Physical Exam Vitals and nursing note reviewed.  Constitutional:      General: He is not in acute distress.    Appearance: Normal appearance. He is well-developed, normal weight and well-nourished.  HENT:     Head: Normocephalic and atraumatic.     Nose: Nose normal.     Mouth/Throat:     Mouth: Oropharynx is  clear and moist. Mucous membranes are moist.     Pharynx: Oropharynx is clear.  Eyes:     Extraocular Movements: EOM normal.     Conjunctiva/sclera: Conjunctivae normal.     Pupils: Pupils are equal, round, and reactive to light.  Cardiovascular:     Rate and Rhythm: Normal rate and regular rhythm.     Pulses: Normal pulses and intact distal pulses.     Heart sounds: No murmur heard.   Pulmonary:     Effort: Pulmonary effort is normal. No respiratory distress.     Breath sounds: Normal breath sounds. No  wheezing or rales.  Abdominal:     General: There is no distension.     Palpations: Abdomen is soft.     Tenderness: There is no abdominal tenderness. There is no guarding or rebound.  Musculoskeletal:        General: No tenderness or edema. Normal range of motion.     Cervical back: Normal range of motion and neck supple.     Right lower leg: No edema.     Left lower leg: No edema.  Skin:    General: Skin is warm and dry.     Findings: No erythema or rash.  Neurological:     General: No focal deficit present.     Mental Status: He is alert and oriented to person, place, and time. Mental status is at baseline.  Psychiatric:        Mood and Affect: Mood and affect and mood normal.        Behavior: Behavior normal.        Thought Content: Thought content normal.     ED Results / Procedures / Treatments   Labs (all labs ordered are listed, but only abnormal results are displayed) Labs Reviewed  COMPREHENSIVE METABOLIC PANEL - Abnormal; Notable for the following components:      Result Value   Sodium 134 (*)    Glucose, Bld 195 (*)    AST 55 (*)    ALT 63 (*)    Total Bilirubin 1.4 (*)    All other components within normal limits  CBC WITH DIFFERENTIAL/PLATELET - Abnormal; Notable for the following components:   Platelets 134 (*)    Lymphs Abs 0.5 (*)    All other components within normal limits  CULTURE, BLOOD (ROUTINE X 2)  CULTURE, BLOOD (ROUTINE X 2)  URINE  CULTURE  RESP PANEL BY RT-PCR (FLU A&B, COVID) ARPGX2  LACTIC ACID, PLASMA  PROTIME-INR  APTT  LACTIC ACID, PLASMA  URINALYSIS, ROUTINE W REFLEX MICROSCOPIC    EKG EKG Interpretation  Date/Time:  Tuesday October 27 2020 08:36:38 EST Ventricular Rate:  76 PR Interval:    QRS Duration: 84 QT Interval:  348 QTC Calculation: 392 R Axis:   1 Text Interpretation: Sinus rhythm No significant change since last tracing Confirmed by Gwyneth Sproutlunkett, Salayah Meares ([id number]) on 10/27/2020 10:05:19 AM   Radiology DG Chest Port 1 View  Result Date: 10/27/2020 CLINICAL DATA:  Questionable sepsis - evaluate for abnormality EXAM: PORTABLE CHEST 1 VIEW COMPARISON:  02/14/2000 report and prior. FINDINGS: No focal consolidation. No pneumothorax or pleural effusion. Cardiomediastinal silhouette is within normal limits. No acute osseous abnormality. IMPRESSION: No focal airspace disease. Electronically Signed   By: Stana Buntinghikanele  Emekauwa M.D.   On: 10/27/2020 08:20    Procedures Procedures (including critical care time)  Medications Ordered in ED Medications  lactated ringers infusion (has no administration in time range)  lactated ringers bolus 1,000 mL (has no administration in time range)  acetaminophen (TYLENOL) tablet 650 mg (650 mg Oral Given 10/27/20 [id number])    ED Course  I have reviewed the triage vital signs and the nursing notes.  Pertinent labs & imaging results that were available during my care of the patient were reviewed by me and considered in my medical decision making (see chart for details).    MDM Rules/Calculators/A&P                          Immune compromised patient due to liver transplant on  antirejection medication presenting today with complaints of fatigue and feeling lightheaded with standing.  Patient was noted to have a temperature of 100.8 upon arrival here and sepsis work-up initiated.  Chest x-ray is within normal limits.  Patient received Tylenol prior to my evaluation but  reports he starting to feel better.  Will give IV fluids.  At this time fever of unknown origin as patient has no chest or abdominal complaints.  He has no evidence of cellulitis and low suspicion for meningitis.  We will also check for Covid at this time.  No evidence of septic shock at this time. Vital signs remained stable.  Lactic acid within normal limits, CMP with normal sodium, renal function and AST of 55 and ALT of 63 which are slightly elevated compared to most recent labs on 10/20/20 with AST of 40 and ALT of 48, CBC within normal limits, PT/INR without acute findings and chest x-ray within normal limits.  EKG without acute findings.  Patient was given 1 dose of broad-spectrum antibiotics.  Covid is neg and UA is pending.  Discussed with Dr. Lucianne Muss at [facility] and he reports that pt could be admitted here and would not need transport at this time.  Then spoke with liver transplant coordinator and she reports that patient is supposed to be on prednisone daily but patient reports he ran out last week so has had approximately 7 days without prednisone.  She reports his liver numbers were much higher when he had to start the prednisone so she is reassured that they have not increased significantly.  However she recommends talking with his hepatologist Dr. Sherryll Burger for further recommendation.  12:19 PM Spoke with Dr. Leroy Sea with [facility] hepatology and at this time he wants to restart prednisone. Will admit for sepsis r/o for FUO.  Dr. Leroy Sea can be reached at [phone]   MDM Number of Diagnoses or Management Options   Amount and/or Complexity of Data Reviewed Clinical lab tests: ordered and reviewed Tests in the radiology section of CPT: ordered and reviewed Tests in the medicine section of CPT: ordered and reviewed Decide to obtain previous medical records or to obtain history from someone other than the patient: yes Obtain history from someone other than the patient: yes Review and summarize past  medical records: yes Discuss the patient with other providers: yes Independent visualization of images, tracings, or specimens: yes  Risk of Complications, Morbidity, and/or Mortality Presenting problems: moderate Diagnostic procedures: low Management options: low  Patient Progress Patient progress: stable  CRITICAL CARE Performed by: Tramar Brueckner Total critical care time: 45 minutes Critical care time was exclusive of separately billable procedures and treating other patients. Critical care was necessary to treat or prevent imminent or life-threatening deterioration. Critical care was time spent personally by me on the following activities: development of treatment plan with patient and/or surrogate as well as nursing, discussions with consultants, evaluation of patient's response to treatment, examination of patient, obtaining history from patient or surrogate, ordering and performing treatments and interventions, ordering and review of laboratory studies, ordering and review of radiographic studies, pulse oximetry and re-evaluation of patient's condition.  Final Clinical Impression(s) / ED Diagnoses Final diagnoses:  Immunocompromised (HCC)  Fever of unknown origin    Rx / DC Orders ED Discharge Orders    None       Gwyneth Sprout, MD 10/27/20 1224

## 2020-10-27 NOTE — ED Notes (Signed)
Pt took all normal morning home medications with MD approval.

## 2020-10-27 NOTE — H&P (Signed)
History and Physical        Hospital Admission Note Date: 10/27/2020  Patient name: Timothy ReaderSteven D Owusu Medical record number: [id number] Date of birth: 06/16/1966 Age: 54 y.o. Gender: male  PCP: Blair HeysEhinger, Robert, MD   Chief Complaint    Chief Complaint  Patient presents with  . Fever  . Fatigue      HPI:   This is a 54 year old male who has been vaccinated against COVID-19 but has not received his booster with past medical history of nonalcoholic cirrhosis s/p [facility] currently on antirejection medication, hypertension, sclerosing cholangitis, ulcerative colitis s/p total colectomy who presented to the ED with complaints of fatigue and lightheadedness with standing since yesterday.  Reported some chest congestion which was very brief and only lasted 30 minutes and has since resolved and was nonradiating.  Did have some diarrhea the past few days which was worse than his typical loose stools.  This morning when he woke up however he did not feel well.  Apparently his LFTs were elevated recently and he was [facility] on [facility] and has been tapering down.  He was under the impression that when his prescription ran out he would be done with this medication and so he did not get a refill and so he ran out of prednisone about 7 days ago.  ED [facility] who stated that plan was to continue prednisone 5 mg daily.   ED Course: Febrile, hemodynamically stable, on room air. Notable Labs: Sodium 134, K3.8, glucose 195, BUN 15, creatinine 1.08, AST 55, ALT 63, T bili 1.4, lactic acid 1.8, WBC 7.0, Hb 15.6, UA unremarkable, flu and Covid negative. Notable Imaging: CXR unremarkable.  ED MD discussed with Dr. Ler[facility]Shross with [facility] hepat[facility] [phone]) who recommended restarting his prednisone and admit for sepsis rule out for fever of  unknown origin and no need for transport at this time.  Patient received metronidazole, cefepime, vancomycin, 1 L LR bolus and infusion and Tylenol   Vitals:   10/27/20 1145 10/27/20 1200  BP: (!) 166/103 (!) 162/95  Pulse: 73 76  Resp: 19 17  Temp:    SpO2: 98% 99%     Review of Systems:  Review of Systems  All other systems reviewed and are negative.   Medical/Social/Family History   Past Medical History: Past Medical History:  Diagnosis Date  . Gout attack 2012   [facility]   Marland Kitchen. Hypertension   . Non-alcoholic cirrhosis (HCC) 2000   s/p transplant of liver in 2006  . Sclerosing cholangitis   . Ulcerative colitis 1990   s/p colectomy    Past Surgical History:  Procedure Laterality Date  . LIVER TRANSPLANT  [address]   [place]  . TOTAL COLECTOMY  07/2011 and    for ulcerative colitis, ostomy x 1 mos, reversal in Nov 2012    Medications: Prior to Admission medications   Medication Sig Start Date End Date Taking? Authorizing Provider  allopurinol (ZYLOPRIM) 100 MG tablet Take 200 mg by mouth daily. Prescribed by Hepatologist (Dr. Jacqualine MauZacks) in [place].   Yes [provider]  doxazosin (CARDURA) 2  MG tablet Take 2 mg by mouth in the morning. 10/10/20  Yes [provider]  metFORMIN (GLUCOPHAGE-XR) 500 MG 24 hr tablet Take 1,000 mg by mouth daily with breakfast. 08/13/20  Yes [provider]  metoprolol succinate (TOPROL-XL) 50 MG 24 hr tablet Take 50 mg by mouth daily. 10/23/20  Yes [provider]  mycophenolate (CELLCEPT) 500 MG tablet Take 500 mg by mouth 2 (two) times daily.   Yes [provider]  tacrolimus (PROGRAF) 1 MG capsule Take 9 mg by mouth 2 (two) times daily. Prescribed by Hepatologist (Dr. Jacqualine Mau) in [place].   Yes [provider]  amLODipine (NORVASC) 10 MG tablet TAKE ONE TABLET BY MOUTH DAILY. Patient not taking: Reported on 10/27/2020 01/02/13   Leona Singleton, MD   aspirin EC 325 MG tablet Take 325 mg by mouth every 6 (six) hours as needed (headache).    [provider]  lisinopril (PRINIVIL,ZESTRIL) 10 MG tablet Take 1 tablet (10 mg total) by mouth daily. 01/03/13 01/03/14  Macy Mis, MD  loperamide (IMODIUM) 2 MG capsule Take 1 capsule (2 mg total) by mouth 4 (four) times daily as needed for diarrhea or loose stools. Patient not taking: No sig reported 10/07/14   Shirleen Schirmer, PA-C  ondansetron (ZOFRAN) 4 MG tablet Take 1 tablet (4 mg total) by mouth every 6 (six) hours. Patient not taking: No sig reported 10/07/14   Shirleen Schirmer, PA-C    Allergies:   Allergies  Allergen Reactions  . Lisinopril     Swelling    Social History:  reports that he has never smoked. He has never used smokeless tobacco. He reports that he does not drink alcohol and does not use drugs.  Family History: Family History  Problem Relation Age of Onset  . Ulcerative colitis Brother   . Cirrhosis Brother        non- alcoholic   . Diabetes Mother   . Diabetes Father   . Heart disease Father 62       s/p triple bypass in 2005     Objective   Physical Exam: Blood pressure (!) 162/95, pulse 76, temperature 98.8 F (37.1 C), resp. rate 17, height 6\' 2"  (1.88 m), weight 108.9 kg, SpO2 99 %.  Physical Exam Vitals and nursing note reviewed.  Constitutional:      Appearance: Normal appearance.  HENT:     Head: Normocephalic and atraumatic.  Eyes:     Conjunctiva/sclera: Conjunctivae normal.  Cardiovascular:     Rate and Rhythm: Normal rate and regular rhythm.  Pulmonary:     Effort: Pulmonary effort is normal.     Breath sounds: Normal breath sounds.  Abdominal:     General: Abdomen is flat.     Palpations: Abdomen is soft.  Musculoskeletal:        General: No swelling or tenderness.  Skin:    Coloration: Skin is not jaundiced or pale.  Neurological:     Mental Status: He is alert. Mental status is at baseline.  Psychiatric:        Mood  and Affect: Mood normal.        Behavior: Behavior normal.     LABS on Admission: I have personally reviewed all the labs and imaging below    Basic Metabolic Panel: Recent Labs  Lab 10/27/20 0857  NA 134*  K 3.8  CL 101  CO2 22  GLUCOSE 195*  BUN 15  CREATININE 1.08  CALCIUM 9.4  Liver Function Tests: Recent Labs  Lab 10/27/20 0857  AST 55*  ALT 63*  ALKPHOS 50  BILITOT 1.4*  PROT 8.0  ALBUMIN 3.9   No results for input(s): LIPASE, AMYLASE in the last 168 hours. No results for input(s): AMMONIA in the last 168 hours. CBC: Recent Labs  Lab 10/27/20 0857  WBC 7.0  NEUTROABS 5.9  HGB 15.6  HCT 45.2  MCV 85.6  PLT 134*   Cardiac Enzymes: No results for input(s): CKTOTAL, CKMB, CKMBINDEX, TROPONINI in the last 168 hours. BNP: Invalid input(s): POCBNP CBG: No results for input(s): GLUCAP in the last 168 hours.  Radiological Exams on Admission:  DG Chest Port 1 View  Result Date: 10/27/2020 CLINICAL DATA:  Questionable sepsis - evaluate for abnormality EXAM: PORTABLE CHEST 1 VIEW COMPARISON:  02/14/2000 report and prior. FINDINGS: No focal consolidation. No pneumothorax or pleural effusion. Cardiomediastinal silhouette is within normal limits. No acute osseous abnormality. IMPRESSION: No focal airspace disease. Electronically Signed   By: Stana Bunting M.D.   On: 10/27/2020 08:20      EKG: normal EKG, normal sinus rhythm   A & P   Active Problems:   Fever   1. Fever of unknown origin in an immunocompromised patient a. ED MD discussed with Dr. Leroy Sea with [facility] hepatology [phone]) who recommended restarting his prednisone and admit for sepsis rule out for fever of unknown origin and no need for transport at this time b. UA and CXR unremarkable c. IV fluids d. Follow-up blood cultures e. Hold immunosuppressants f. Continue vancomycin, cefepime and metronidazole p.o.  2. Elevated LFTs in the setting of liver transplant a. Restart  prednisone 5 mg and continue at discharge with close outpatient follow-up  3. Hypertension a. Continue home meds  4. Prediabetes a. Glucose 195 b. Check HbA1c c. Sensitive sliding scale    DVT prophylaxis: Lovenox   Code Status: Not on file  Diet: Heart healthy/carb modified Family Communication: Admission, patients condition and plan of care including tests being ordered have been discussed with the patient who indicates understanding and agrees with the plan and Code Status.   Disposition Plan: The appropriate patient status for this patient is OBSERVATION. Observation status is judged to be reasonable and necessary in order to provide the required intensity of service to ensure the patient's safety. The patient's presenting symptoms, physical exam findings, and initial radiographic and laboratory data in the context of their medical condition is felt to place them at decreased risk for further clinical deterioration. Furthermore, it is anticipated that the patient will be medically stable for discharge from the hospital within 2 midnights of admission. The following factors support the patient status of observation.   " The patient's presenting symptoms include fatigue. " The physical exam findings include unremarkable. " The initial radiographic and laboratory data are positive for fever.    Status is: Observation  The patient remains OBS appropriate and will d/c before 2 midnights.  Dispo: The patient is from: Home              Anticipated d/c is to: Home              Anticipated d/c date is: 1 day              Patient currently is not medically stable to d/c.     Consultants  . None  Procedures  . None  Time Spent on Admission: 58 minutes    Jae Dire, DO [facility]  10/27/2020, 12:39 PM

## 2020-10-27 NOTE — Progress Notes (Signed)
Following for code sepsis 

## 2020-10-27 NOTE — ED Triage Notes (Signed)
Patient reports having dizziness, chest congestion, and dizziness since yesterday. Patient had a T. 100.8 in triage. Patient has a history of liver transplant patient.

## 2020-10-27 NOTE — Progress Notes (Signed)
A consult was received from an ED physician for Cefepime and Vancomycin per pharmacy dosing.  The patient's profile has been reviewed for ht/wt/allergies/indication/available labs.   A one time order has been placed for Cefepime 2g and Vancomycin 2g.  Further antibiotics/pharmacy consults should be ordered by admitting physician if indicated.                       Thank you, Lynann Beaver PharmD, BCPS Clinical Pharmacist [facility] main pharmacy [phone] 10/27/2020 9:56 AM

## 2020-10-27 NOTE — Progress Notes (Signed)
Notified provider of need to order antibiotics.   

## 2020-10-27 NOTE — ED Notes (Signed)
Report called to floor

## 2020-10-27 NOTE — Progress Notes (Signed)
Pharmacy Antibiotic Note  EHAN FREAS is a 54 y.o. male admitted on 10/27/2020 with sepsis.  Pharmacy has been consulted for vanc/cefepime dosing.  Plan: Vanc 2g x 1 given in ED then start 750mg  IV q12 - goal trough 15-20 Cefepime 2g IV q8  Height: 6\' 2"  (188 cm) Weight: 108.9 kg (240 lb) IBW/kg (Calculated) : 82.2  Temp (24hrs), Avg:100.1 F (37.8 C), Min:98.8 F (37.1 C), Max:100.8 F (38.2 C)  Recent Labs  Lab 10/27/20 0857  WBC 7.0  CREATININE 1.08  LATICACIDVEN 1.8    Estimated Creatinine Clearance: 103.9 mL/min (by C-G formula based on SCr of 1.08 mg/dL).    Allergies  Allergen Reactions  . Lisinopril     Swelling    Thank you for allowing pharmacy to be a part of this patient's care.  10/27/2020 2:06 PM

## 2020-10-28 DIAGNOSIS — Z9049 Acquired absence of other specified parts of digestive tract: Secondary | ICD-10-CM | POA: Diagnosis not present

## 2020-10-28 DIAGNOSIS — Z20822 Contact with and (suspected) exposure to covid-19: Secondary | ICD-10-CM | POA: Diagnosis present

## 2020-10-28 DIAGNOSIS — Z833 Family history of diabetes mellitus: Secondary | ICD-10-CM | POA: Diagnosis not present

## 2020-10-28 DIAGNOSIS — I10 Essential (primary) hypertension: Secondary | ICD-10-CM | POA: Diagnosis present

## 2020-10-28 DIAGNOSIS — Z8249 Family history of ischemic heart disease and other diseases of the circulatory system: Secondary | ICD-10-CM | POA: Diagnosis not present

## 2020-10-28 DIAGNOSIS — Z79899 Other long term (current) drug therapy: Secondary | ICD-10-CM | POA: Diagnosis not present

## 2020-10-28 DIAGNOSIS — M109 Gout, unspecified: Secondary | ICD-10-CM | POA: Diagnosis present

## 2020-10-28 DIAGNOSIS — E1165 Type 2 diabetes mellitus with hyperglycemia: Secondary | ICD-10-CM

## 2020-10-28 DIAGNOSIS — Z944 Liver transplant status: Secondary | ICD-10-CM

## 2020-10-28 DIAGNOSIS — D696 Thrombocytopenia, unspecified: Secondary | ICD-10-CM | POA: Diagnosis present

## 2020-10-28 DIAGNOSIS — R7989 Other specified abnormal findings of blood chemistry: Secondary | ICD-10-CM

## 2020-10-28 DIAGNOSIS — R509 Fever, unspecified: Principal | ICD-10-CM

## 2020-10-28 DIAGNOSIS — Z888 Allergy status to other drugs, medicaments and biological substances status: Secondary | ICD-10-CM | POA: Diagnosis not present

## 2020-10-28 DIAGNOSIS — D849 Immunodeficiency, unspecified: Secondary | ICD-10-CM | POA: Diagnosis present

## 2020-10-28 DIAGNOSIS — Z7982 Long term (current) use of aspirin: Secondary | ICD-10-CM | POA: Diagnosis not present

## 2020-10-28 DIAGNOSIS — E871 Hypo-osmolality and hyponatremia: Secondary | ICD-10-CM | POA: Diagnosis present

## 2020-10-28 DIAGNOSIS — Z7984 Long term (current) use of oral hypoglycemic drugs: Secondary | ICD-10-CM | POA: Diagnosis not present

## 2020-10-28 LAB — GLUCOSE, CAPILLARY
Glucose-Capillary: 149 mg/dL — ABNORMAL HIGH (ref 70–99)
Glucose-Capillary: 194 mg/dL — ABNORMAL HIGH (ref 70–99)
Glucose-Capillary: 180 mg/dL — ABNORMAL HIGH (ref 70–99)
Glucose-Capillary: 228 mg/dL — ABNORMAL HIGH (ref 70–99)

## 2020-10-28 LAB — CBC
HCT: 42 % (ref 39.0–52.0)
Hemoglobin: 14.5 g/dL (ref 13.0–17.0)
MCH: 29.1 pg (ref 26.0–34.0)
MCHC: 34.5 g/dL (ref 30.0–36.0)
MCV: 84.3 fL (ref 80.0–100.0)
Platelets: 117 10*3/uL — ABNORMAL LOW (ref 150–400)
RBC: 4.98 MIL/uL (ref 4.22–5.81)
RDW: 13.2 % (ref 11.5–15.5)
WBC: 4 10*3/uL (ref 4.0–10.5)
nRBC: 0 % (ref 0.0–0.2)

## 2020-10-28 LAB — COMPREHENSIVE METABOLIC PANEL
ALT: 49 U/L — ABNORMAL HIGH (ref 0–44)
AST: 40 U/L (ref 15–41)
Albumin: 3.4 g/dL — ABNORMAL LOW (ref 3.5–5.0)
Alkaline Phosphatase: 42 U/L (ref 38–126)
Anion gap: 12 (ref 5–15)
BUN: 13 mg/dL (ref 6–20)
CO2: 21 mmol/L — ABNORMAL LOW (ref 22–32)
Calcium: 8.8 mg/dL — ABNORMAL LOW (ref 8.9–10.3)
Chloride: 101 mmol/L (ref 98–111)
Creatinine, Ser: 0.99 mg/dL (ref 0.61–1.24)
GFR, Estimated: >60 mL/min (ref >60)
Glucose, Bld: 139 mg/dL — ABNORMAL HIGH (ref 70–99)
Potassium: 3.6 mmol/L (ref 3.5–5.1)
Sodium: 134 mmol/L — ABNORMAL LOW (ref 135–145)
Total Bilirubin: 1.9 mg/dL — ABNORMAL HIGH (ref 0.3–1.2)
Total Protein: 7.3 g/dL (ref 6.5–8.1)

## 2020-10-28 LAB — URINE CULTURE: Culture: NO GROWTH

## 2020-10-28 LAB — PROCALCITONIN: Procalcitonin: 2.59 ng/mL

## 2020-10-28 LAB — PROTIME-INR
INR: 1.2 (ref 0.8–1.2)
Prothrombin Time: 14.7 seconds (ref 11.4–15.2)

## 2020-10-28 MED ORDER — PREDNISONE 5 MG PO TABS
5.0000 mg | ORAL_TABLET | Freq: Every day | ORAL | Status: DC
  Administered 2020-10-28 – 2020-10-29 (×2): 5 mg via ORAL
  Filled 2020-10-28 (×2): qty 1

## 2020-10-28 NOTE — Progress Notes (Signed)
Patient ID: Timothy Riggs, male   DOB: 1966-05-11, 54 y.o.   MRN: [id number]  PROGRESS NOTE    Timothy Riggs  IFO:[id number] DOB: 1966-04-23 DOA: 10/27/2020 PCP: Blair Heys, MD   Brief Narrative:  54 year old male who has been vaccinated against COVID-19 but has not received his booster with past medical history of nonalcoholic cirrhosis s/p liver transplant at [facility] in 2006 currently on antirejection medication, hypertension, sclerosing cholangitis, ulcerative colitis s/p total colectomy who presented to the ED with complaints of fatigue and lightheadedness with standing along with some diarrhea.  On presentation, he was febrile but hemodynamically stable with AST 55, ALT of 63, T bili of 1.4, lactic acid of 1.8, WBCs of 7.  UA was unremarkable.  Influenza and COVID-19 testing were negative.  Chest x-ray was unremarkable.  ED provider discussed with Dr. Leroy Sea with [facility] hepatology [phone]) who recommended restarting his prednisone and admit for fever work-up and no need for transport at this time.  Patient was started on IV fluids and antibiotics.  Assessment & Plan:   Fever in an immunocompromised patient -Questionable cause.  Currently on broad-spectrum antibiotics.  UA negative on presentation.  Chest x-ray was unremarkable. -Patient had a temperature of 100.2 overnight.  Will continue monitoring in the hospital along with antibiotics.  Follow cultures. -DC IV fluids.  Immunosuppressants on hold.  Elevated LFTs History of liver transplantation -ED provider discussed with Dr. Leroy Sea with [facility] hepatology [phone]) who recommended restarting his prednisone.  Outpatient follow-up with hepatology.  Hypertension -Blood pressure on the higher side but stable.  Continue metoprolol  Diabetes mellitus type 2 with hyperglycemia -A1c 7.4.  Continue sliding scale insulin.  Carb modified diet.  Outpatient follow-up with PCP regarding starting  medications.  Thrombocytopenia -Questionable cause.  No signs of bleeding  Mild hyponatremia -Monitor  DVT prophylaxis: Lovenox Code Status: Full Family Communication: Wife at bedside Disposition Plan: Status is: Inpatient  Remains inpatient appropriate because:Inpatient level of care appropriate due to severity of illness   Dispo: The patient is from: Home              Anticipated d/c is to: Home              Anticipated d/c date is: 1 day              Patient currently is not medically stable to d/c.  Consultants: None  Procedures: None  Antimicrobials:  Anti-infectives (From admission, onward)   Start     Dose/Rate Route Frequency Ordered Stop   10/27/20 2200  vancomycin (VANCOREADY) IVPB 750 mg/150 mL        750 mg 150 mL/hr over 60 Minutes Intravenous Every 12 hours 10/27/20 1408     10/27/20 1900  metroNIDAZOLE (FLAGYL) tablet 500 mg        500 mg Oral Every 8 hours 10/27/20 1419     10/27/20 1800  ceFEPIme (MAXIPIME) 2 g in sodium chloride 0.9 % 100 mL IVPB        2 g 200 mL/hr over 30 Minutes Intravenous Every 8 hours 10/27/20 1408     10/27/20 1515  ceFEPIme (MAXIPIME) 2 g in sodium chloride 0.9 % 100 mL IVPB  Status:  Discontinued        2 g 200 mL/hr over 30 Minutes Intravenous  Once 10/27/20 1419 10/27/20 1431   10/27/20 1515  vancomycin (VANCOCIN) IVPB 1000 mg/200 mL premix  Status:  Discontinued        1,000 mg  200 mL/hr over 60 Minutes Intravenous  Once 10/27/20 1419 10/27/20 1434   10/27/20 1400  metroNIDAZOLE (FLAGYL) tablet 500 mg  Status:  Discontinued        500 mg Oral Every 8 hours 10/27/20 0948 10/27/20 1444   10/27/20 1000  ceFEPIme (MAXIPIME) 2 g in sodium chloride 0.9 % 100 mL IVPB        2 g 200 mL/hr over 30 Minutes Intravenous  Once 10/27/20 0948 10/27/20 1042   10/27/20 1000  vancomycin (VANCOCIN) IVPB 1000 mg/200 mL premix  Status:  Discontinued        1,000 mg 200 mL/hr over 60 Minutes Intravenous  Once 10/27/20 0948 10/27/20 0957    10/27/20 1000  vancomycin (VANCOREADY) IVPB 2000 mg/400 mL        2,000 mg 200 mL/hr over 120 Minutes Intravenous  Once 10/27/20 0957 10/27/20 1302       Subjective: Patient seen and examined at bedside.  Denies worsening abdominal pain, nausea, vomiting, fever.  Denies worsening diarrhea although patient has chronic diarrhea.  Denies worsening cough or shortness of breath.  Objective: Vitals:   10/27/20 1929 10/27/20 2310 10/28/20 0320 10/28/20 0600  BP: (!) 154/91 (!) 159/88 (!) 146/82   Pulse: 64 65 77   Resp: 16 16    Temp: 97.8 F (36.6 C) 100.2 F (37.9 C) 99.2 F (37.3 C)   TempSrc: Oral Oral Oral   SpO2: 99% 97% 97%   Weight:    111.6 kg  Height:        Intake/Output Summary (Last 24 hours) at 10/28/2020 1035 Last data filed at 10/28/2020 0300 Gross per 24 hour  Intake 763.57 ml  Output --  Net 763.57 ml   Filed Weights   10/27/20 0722 10/28/20 0600  Weight: 108.9 kg 111.6 kg    Examination:  General exam: Appears calm and comfortable  Respiratory system: Bilateral decreased breath sounds at bases Cardiovascular system: S1 & S2 heard, Rate controlled Gastrointestinal system: Abdomen is nondistended, soft and nontender. Normal bowel sounds heard. Extremities: No cyanosis, clubbing, edema  Central nervous system: Alert and oriented. No focal neurological deficits. Moving extremities Skin: No rashes, lesions or ulcers Psychiatry: Judgement and insight appear normal. Mood & affect appropriate.     Data Reviewed: I have personally reviewed following labs and imaging studies  CBC: Recent Labs  Lab 10/27/20 0857 10/28/20 0744  WBC 7.0 4.0  NEUTROABS 5.9  --   HGB 15.6 14.5  HCT 45.2 42.0  MCV 85.6 84.3  PLT 134* 117*   Basic Metabolic Panel: Recent Labs  Lab 10/27/20 0857 10/28/20 0744  NA 134* 134*  K 3.8 3.6  CL 101 101  CO2 22 21*  GLUCOSE 195* 139*  BUN 15 13  CREATININE 1.08 0.99  CALCIUM 9.4 8.8*   GFR: Estimated Creatinine  Clearance: 114.7 mL/min (by C-G formula based on SCr of 0.99 mg/dL). Liver Function Tests: Recent Labs  Lab 10/27/20 0857 10/28/20 0744  AST 55* 40  ALT 63* 49*  ALKPHOS 50 42  BILITOT 1.4* 1.9*  PROT 8.0 7.3  ALBUMIN 3.9 3.4*   No results for input(s): LIPASE, AMYLASE in the last 168 hours. No results for input(s): AMMONIA in the last 168 hours. Coagulation Profile: Recent Labs  Lab 10/27/20 0857 10/28/20 0744  INR 1.2 1.2   Cardiac Enzymes: No results for input(s): CKTOTAL, CKMB, CKMBINDEX, TROPONINI in the last 168 hours. BNP (last 3 results) No results for input(s): PROBNP in the last  8760 hours. HbA1C: Recent Labs    10/27/20 0857  HGBA1C 7.4*   CBG: Recent Labs  Lab 10/27/20 2054  GLUCAP 202*   Lipid Profile: No results for input(s): CHOL, HDL, LDLCALC, TRIG, CHOLHDL, LDLDIRECT in the last 72 hours. Thyroid Function Tests: No results for input(s): TSH, T4TOTAL, FREET4, T3FREE, THYROIDAB in the last 72 hours. Anemia Panel: No results for input(s): VITAMINB12, FOLATE, FERRITIN, TIBC, IRON, RETICCTPCT in the last 72 hours. Sepsis Labs: Recent Labs  Lab 10/27/20 0857 10/28/20 0744  PROCALCITON  --  2.59  LATICACIDVEN 1.8  --     Recent Results (from the past 240 hour(s))  Blood Culture (routine x 2)     Status: None (Preliminary result)   Collection Time: 10/27/20  8:50 AM   Specimen: BLOOD LEFT FOREARM  Result Value Ref Range Status   Specimen Description   Final    BLOOD LEFT FOREARM Performed at [facility], [address] [place], [place] [address]    Special Requests   Final    BOTTLES DRAWN AEROBIC AND ANAEROBIC Blood Culture adequate volume Performed at [facility], [address] [place], [place] [address]    Culture PENDING  Incomplete   Report Status PENDING  Incomplete  Resp Panel by RT-PCR (Flu A&B, Covid) Nasopharyngeal Swab     Status: None   Collection Time: 10/27/20  8:55 AM   Specimen: Nasopharyngeal  Swab; Nasopharyngeal(NP) swabs in vial transport medium  Result Value Ref Range Status   SARS Coronavirus 2 by RT PCR NEGATIVE NEGATIVE Final    Comment: (NOTE) SARS-CoV-2 target nucleic acids are NOT DETECTED.  The SARS-CoV-2 RNA is generally detectable in upper respiratory specimens during the acute phase of infection. The lowest concentration of SARS-CoV-2 viral copies this assay can detect is 138 copies/mL. A negative result does not preclude SARS-Cov-2 infection and should not be used as the sole basis for treatment or other patient management decisions. A negative result may occur with  improper specimen collection/handling, submission of specimen other than nasopharyngeal swab, presence of viral mutation(s) within the areas targeted by this assay, and inadequate number of viral copies(<138 copies/mL). A negative result must be combined with clinical observations, patient history, and epidemiological information. The expected result is Negative.  Fact Sheet for Patients:  BloggerCourse.comhttps://www.fda.gov/media/152166/download  Fact Sheet for Healthcare Providers:  SeriousBroker.ithttps://www.fda.gov/media/152162/download  This test is no t yet approved or cleared by the [place] FDA and  has been authorized for detection and/or diagnosis of SARS-CoV-2 by FDA under an Emergency Use Authorization (EUA). This EUA will remain  in effect (meaning this test can be used) for the duration of the COVID-19 declaration under Section 564(b)(1) of the Act, 21 U.S.C.section 360bbb-3(b)(1), unless the authorization is terminated  or revoked sooner.       Influenza A by PCR NEGATIVE NEGATIVE Final   Influenza B by PCR NEGATIVE NEGATIVE Final    Comment: (NOTE) The Xpert Xpress SARS-CoV-2/FLU/RSV plus assay is intended as an aid in the diagnosis of influenza from Nasopharyngeal swab specimens and should not be used as a sole basis for treatment. Nasal washings and aspirates are unacceptable for Xpert Xpress  SARS-CoV-2/FLU/RSV testing.  Fact Sheet for Patients: BloggerCourse.comhttps://www.fda.gov/media/152166/download  Fact Sheet for Healthcare Providers: SeriousBroker.ithttps://www.fda.gov/media/152162/download  This test is not yet approved or cleared by the [place] FDA and has been authorized for detection and/or diagnosis of SARS-CoV-2 by FDA under an Emergency Use Authorization (EUA). This EUA will remain in effect (meaning  this test can be used) for the duration of the COVID-19 declaration under Section 564(b)(1) of the Act, 21 U.S.C. section 360bbb-3(b)(1), unless the authorization is terminated or revoked.  Performed at [facility], [address], [place], [place] [address]   Urine culture     Status: None   Collection Time: 10/27/20  9:57 AM   Specimen: In/Out Cath Urine  Result Value Ref Range Status   Specimen Description   Final    IN/OUT CATH URINE Performed at [facility], [address] [place], [place] [address]    Special Requests   Final    NONE Performed at [facility], [address] [place], [place] [address]    Culture   Final    NO GROWTH Performed at [facility], [address] [place], [place] [address]    Report Status 10/28/2020 FINAL  Final         Radiology Studies: DG Chest Port 1 View  Result Date: 10/27/2020 CLINICAL DATA:  Questionable sepsis - evaluate for abnormality EXAM: PORTABLE CHEST 1 VIEW COMPARISON:  02/14/2000 report and prior. FINDINGS: No focal consolidation. No pneumothorax or pleural effusion. Cardiomediastinal silhouette is within normal limits. No acute osseous abnormality. IMPRESSION: No focal airspace disease. Electronically Signed   By: Stana Bunting M.D.   On: 10/27/2020 08:20        Scheduled Meds: . enoxaparin (LOVENOX) injection  40 mg Subcutaneous Q24H  . insulin aspart  0-9 Units Subcutaneous TID WC  . metoprolol succinate  50 mg Oral Daily  .  metroNIDAZOLE  500 mg Oral Q8H   Continuous Infusions: . ceFEPime (MAXIPIME) IV 2 g (10/28/20 0937)  . vancomycin 750 mg (10/27/20 2128)          Glade Lloyd, MD [facility] 10/28/2020, 10:35 AM

## 2020-10-29 DIAGNOSIS — I10 Essential (primary) hypertension: Secondary | ICD-10-CM | POA: Diagnosis not present

## 2020-10-29 DIAGNOSIS — R509 Fever, unspecified: Secondary | ICD-10-CM | POA: Diagnosis not present

## 2020-10-29 DIAGNOSIS — Z944 Liver transplant status: Secondary | ICD-10-CM | POA: Diagnosis not present

## 2020-10-29 DIAGNOSIS — E1165 Type 2 diabetes mellitus with hyperglycemia: Secondary | ICD-10-CM | POA: Diagnosis not present

## 2020-10-29 LAB — COMPREHENSIVE METABOLIC PANEL
ALT: 47 U/L — ABNORMAL HIGH (ref 0–44)
AST: 46 U/L — ABNORMAL HIGH (ref 15–41)
Albumin: 3.5 g/dL (ref 3.5–5.0)
Alkaline Phosphatase: 48 U/L (ref 38–126)
Anion gap: 10 (ref 5–15)
BUN: 16 mg/dL (ref 6–20)
CO2: 25 mmol/L (ref 22–32)
Calcium: 8.9 mg/dL (ref 8.9–10.3)
Chloride: 102 mmol/L (ref 98–111)
Creatinine, Ser: 0.96 mg/dL (ref 0.61–1.24)
GFR, Estimated: >60 mL/min (ref >60)
Glucose, Bld: 155 mg/dL — ABNORMAL HIGH (ref 70–99)
Potassium: 3.8 mmol/L (ref 3.5–5.1)
Sodium: 137 mmol/L (ref 135–145)
Total Bilirubin: 1.2 mg/dL (ref 0.3–1.2)
Total Protein: 7.9 g/dL (ref 6.5–8.1)

## 2020-10-29 LAB — CBC
HCT: 44.4 % (ref 39.0–52.0)
Hemoglobin: 15.4 g/dL (ref 13.0–17.0)
MCH: 29.4 pg (ref 26.0–34.0)
MCHC: 34.7 g/dL (ref 30.0–36.0)
MCV: 84.7 fL (ref 80.0–100.0)
Platelets: 124 10*3/uL — ABNORMAL LOW (ref 150–400)
RBC: 5.24 MIL/uL (ref 4.22–5.81)
RDW: 13.2 % (ref 11.5–15.5)
WBC: 3.6 10*3/uL — ABNORMAL LOW (ref 4.0–10.5)
nRBC: 0 % (ref 0.0–0.2)

## 2020-10-29 LAB — MAGNESIUM: Magnesium: 1.9 mg/dL (ref 1.7–2.4)

## 2020-10-29 LAB — GLUCOSE, CAPILLARY: Glucose-Capillary: 156 mg/dL — ABNORMAL HIGH (ref 70–99)

## 2020-10-29 MED ORDER — PREDNISONE 5 MG PO TABS: 5.0000 mg | ORAL_TABLET | Freq: Every day | ORAL | 0 refills | Status: AC

## 2020-10-29 NOTE — Discharge Summary (Addendum)
Physician Discharge Summary  JAHMAD PETRICH KCM:[id number] DOB: [birth year] DOA: 10/27/2020  PCP: Blair Heys, MD  Admit date: 10/27/2020 Discharge date: 10/29/2020  Admitted From: Home Disposition: Home  Recommendations for Outpatient Follow-up:  1. Follow up with PCP in 1 week with repeat CBC/BMP 2. Outpatient follow-up with transplant physician 3. 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  On presentation, he was febrile but hemodynamically stable with AST 55, ALT of 63, T bili of 1.4, lactic acid of 1.8, WBCs of 7.  UA was unremarkable.  Influenza and COVID-19 testing were negative.  Chest x-ray was unremarkable.  ED provider discussed with Dr. Leroy Sea with [facility] hepatology [phone]) who recommended restarting his prednisone and admit for fever work-up and no need for transport at this time.  Patient was started on IV fluids and antibiotics.  During the hospitalization, patient's condition has improved.  He is currently afebrile, tolerating diet and hemodynamically stable.  No source of infection identified.  Will discharge home today off antibiotics.  Discharge Diagnoses:   Fever in an immunocompromised patient Sepsis has been ruled out -Questionable cause.  Currently on broad-spectrum antibiotics.  UA negative on presentation.  Chest x-ray was unremarkable. -Treated with broad-spectrum antibiotics and IV  fluids.  Afebrile for the last 24 hours.  No source of infection identified.  Will DC IV antibiotics and discharge him off antibiotics.  Off IV fluids already.  Tolerating diet and hemodynamically stable.  Outpatient follow-up with PCP.  Elevated LFTs History of liver transplantation -ED provider discussed with Dr. Leroy Sea with [facility] hepatology [phone]) who recommended restarting his prednisone.  Outpatient follow-up with hepatology.  Hypertension -Blood pressure on the higher side but stable.  Continue home regimen on discharge.  Diabetes mellitus type 2 with hyperglycemia -A1c 7.4.   Carb modified diet.  Outpatient follow-up with PCP regarding starting medications.  Thrombocytopenia -Questionable cause.  No signs of bleeding  Mild hyponatremia -Improved.  Discharge Instructions  Discharge Instructions    Diet - low sodium heart healthy   Complete by: As directed    Increase activity slowly   Complete by: As directed      Allergies as of 10/29/2020      Reactions   Lisinopril    Swelling      Medication List    STOP taking these medications   ondansetron 4 MG tablet Commonly known as: ZOFRAN     TAKE these medications   allopurinol 100 MG tablet Commonly known as: ZYLOPRIM Take 200 mg by mouth daily. Prescribed by Hepatologist (Dr. Jacqualine Mau) in [place].   amLODipine 10 MG tablet Commonly known as: NORVASC TAKE ONE TABLET BY MOUTH DAILY.   aspirin EC 325 MG tablet Take 325 mg by mouth every 6 (six) hours as needed (headache).   doxazosin 2 MG tablet Commonly known as: CARDURA Take 2 mg by mouth in the morning.   lisinopril 10 MG tablet Commonly known as: ZESTRIL Take 1 tablet (10 mg total) by mouth daily.   loperamide 2 MG capsule Commonly known as:  IMODIUM Take 1 capsule (2 mg total) by mouth 4 (four) times daily as needed for diarrhea or loose stools.   metFORMIN 500 MG 24 hr tablet Commonly known as: GLUCOPHAGE-XR Take 1,000 mg by mouth  daily with breakfast.   metoprolol succinate 50 MG 24 hr tablet Commonly known as: TOPROL-XL Take 50 mg by mouth daily.   mycophenolate 500 MG tablet Commonly known as: CELLCEPT Take 500 mg by mouth 2 (two) times daily.   predniSONE 5 MG tablet Commonly known as: DELTASONE Take 1 tablet (5 mg total) by mouth daily with breakfast.   tacrolimus 1 MG capsule Commonly known as: PROGRAF Take 9 mg by mouth 2 (two) times daily. Prescribed by Hepatologist (Dr. Jacqualine Mau) in [place].       Follow-up Information    Blair Heys, MD. Schedule an appointment as soon as possible for a visit in 1 week(s).   Specialty: Family Medicine Why: with repeat cbc/bmp Contact information: [address], [address] [place] [address] [phone]              Allergies  Allergen Reactions  . Lisinopril     Swelling    Consultations:  None   Procedures/Studies: DG Chest Port 1 View  Result Date: 10/27/2020 CLINICAL DATA:  Questionable sepsis - evaluate for abnormality EXAM: PORTABLE CHEST 1 VIEW COMPARISON:  02/14/2000 report and prior. FINDINGS: No focal consolidation. No pneumothorax or pleural effusion. Cardiomediastinal silhouette is within normal limits. No acute osseous abnormality. IMPRESSION: No focal airspace disease. Electronically Signed   By: Stana Bunting M.D.   On: 10/27/2020 08:20       Subjective: Patient seen and examined at bedside.  Denies any overnight fever, nausea, vomiting or abdominal pain.  Wants to go home today.  Discharge Exam: Vitals:   10/28/20 2151 10/29/20 0559  BP: (!) 155/93 (!) 141/93  Pulse: (!) 59 (!) 57  Resp: 18 18  Temp: 98 F (36.7 C) 98.4 F (36.9 C)  SpO2: 98% 99%    General: Pt is alert, awake, not in acute distress Cardiovascular: Bradycardic, S1/S2 + Respiratory: bilateral decreased breath sounds at bases Abdominal: Soft, NT, ND, bowel sounds + Extremities: Trace lower extremity edema, no  cyanosis    The results of significant diagnostics from this hospitalization (including imaging, microbiology, ancillary and laboratory) are listed below for reference.     Microbiology: Recent Results (from the past 240 hour(s))  Blood Culture (routine x 2)     Status: None (Preliminary result)   Collection Time: 10/27/20  8:40 AM   Specimen: BLOOD  Result Value Ref Range Status   Specimen Description   Final    BLOOD RIGHT ANTECUBITAL Performed at [facility], [address], [place], [place] [address]    Special Requests   Final    BOTTLES DRAWN AEROBIC AND ANAEROBIC Blood Culture adequate volume Performed at [facility], [address], [place], [place] [address]    Culture   Final    NO GROWTH 1 DAY Performed at [facility], [address], [place], [place] [address]    Report Status PENDING  Incomplete  Blood Culture (routine x 2)     Status: None (Preliminary result)   Collection Time: 10/27/20  8:50 AM   Specimen: BLOOD LEFT FOREARM  Result Value Ref Range Status   Specimen Description   Final    BLOOD LEFT FOREARM Performed at [facility], [address],  [place], [place] [address]    Special Requests   Final    BOTTLES DRAWN AEROBIC AND ANAEROBIC Blood Culture adequate volume Performed at [facility], [address], [place], [place] [address]    Culture   Final    NO GROWTH 1 DAY Performed at [facility], [address], [place], [place] [address]    Report Status PENDING  Incomplete  Resp Panel by RT-PCR (Flu A&B, Covid) Nasopharyngeal Swab     Status: None   Collection Time: 10/27/20  8:55 AM   Specimen: Nasopharyngeal Swab; Nasopharyngeal(NP) swabs in vial transport medium  Result Value Ref Range Status   SARS Coronavirus 2 by RT PCR NEGATIVE NEGATIVE Final    Comment: (NOTE) SARS-CoV-2 target nucleic acids are NOT DETECTED.  The SARS-CoV-2 RNA is generally detectable in  upper respiratory specimens during the acute phase of infection. The lowest concentration of SARS-CoV-2 viral copies this assay can detect is 138 copies/mL. A negative result does not preclude SARS-Cov-2 infection and should not be used as the sole basis for treatment or other patient management decisions. A negative result may occur with  improper specimen collection/handling, submission of specimen other than nasopharyngeal swab, presence of viral mutation(s) within the areas targeted by this assay, and inadequate number of viral copies(<138 copies/mL). A negative result must be combined with clinical observations, patient history, and epidemiological information. The expected result is Negative.  Fact Sheet for Patients:  BloggerCourse.com  Fact Sheet for Healthcare Providers:  SeriousBroker.it  This test is no t yet approved or cleared by the [place] FDA and  has been authorized for detection and/or diagnosis of SARS-CoV-2 by FDA under an Emergency Use Authorization (EUA). This EUA will remain  in effect (meaning this test can be used) for the duration of the COVID-19 declaration under Section 564(b)(1) of the Act, 21 U.S.C.section 360bbb-3(b)(1), unless the authorization is terminated  or revoked sooner.       Influenza A by PCR NEGATIVE NEGATIVE Final   Influenza B by PCR NEGATIVE NEGATIVE Final    Comment: (NOTE) The Xpert Xpress SARS-CoV-2/FLU/RSV plus assay is intended as an aid in the diagnosis of influenza from Nasopharyngeal swab specimens and should not be used as a sole basis for treatment. Nasal washings and aspirates are unacceptable for Xpert Xpress SARS-CoV-2/FLU/RSV testing.  Fact Sheet for Patients: BloggerCourse.com  Fact Sheet for Healthcare Providers: SeriousBroker.it  This test is not yet approved or cleared by the [place] FDA and has been  authorized for detection and/or diagnosis of SARS-CoV-2 by FDA under an Emergency Use Authorization (EUA). This EUA will remain in effect (meaning this test can be used) for the duration of the COVID-19 declaration under Section 564(b)(1) of the Act, 21 U.S.C. section 360bbb-3(b)(1), unless the authorization is terminated or revoked.  Performed at [facility], [address], [place], [place] [address]   Urine culture     Status: None   Collection Time: 10/27/20  9:57 AM   Specimen: In/Out Cath Urine  Result Value Ref Range Status   Specimen Description   Final    IN/OUT CATH URINE Performed at [facility], [address], [place], [place] [address]    Special Requests   Final    NONE Performed at [facility], [address] [place], [place] [address]    Culture   Final    NO GROWTH Performed at [facility], [address],  [place], [place] [address]    Report Status 10/28/2020 FINAL  Final     Labs: BNP (last 3 results) No results for input(s): BNP in the last 8760 hours. Basic Metabolic Panel: Recent Labs  Lab 10/27/20 0857 10/28/20 0744 10/29/20 0719  NA 134* 134* 137  K 3.8 3.6 3.8  CL 101 101 102  CO2 22 21* 25  GLUCOSE 195* 139* 155*  BUN 15 13 16   CREATININE 1.08 0.99 0.96  CALCIUM 9.4 8.8* 8.9  MG  --   --  1.9   Liver Function Tests: Recent Labs  Lab 10/27/20 0857 10/28/20 0744 10/29/20 0719  AST 55* 40 46*  ALT 63* 49* 47*  ALKPHOS 50 42 48  BILITOT 1.4* 1.9* 1.2  PROT 8.0 7.3 7.9  ALBUMIN 3.9 3.4* 3.5   No results for input(s): LIPASE, AMYLASE in the last 168 hours. No results for input(s): AMMONIA in the last 168 hours. CBC: Recent Labs  Lab 10/27/20 0857 10/28/20 0744 10/29/20 0719  WBC 7.0 4.0 3.6*  NEUTROABS 5.9  --   --   HGB 15.6 14.5 15.4  HCT 45.2 42.0 44.4  MCV 85.6 84.3 84.7  PLT 134* 117* 124*   Cardiac Enzymes: No results for input(s): CKTOTAL, CKMB,  CKMBINDEX, TROPONINI in the last 168 hours. BNP: Invalid input(s): POCBNP CBG: Recent Labs  Lab 10/28/20 0755 10/28/20 1142 10/28/20 1716 10/28/20 2150 10/29/20 0804  GLUCAP 149* 194* 180* 228* 156*   D-Dimer No results for input(s): DDIMER in the last 72 hours. Hgb A1c Recent Labs    10/27/20 0857  HGBA1C 7.4*   Lipid Profile No results for input(s): CHOL, HDL, LDLCALC, TRIG, CHOLHDL, LDLDIRECT in the last 72 hours. Thyroid function studies No results for input(s): TSH, T4TOTAL, T3FREE, THYROIDAB in the last 72 hours.  Invalid input(s): FREET3 Anemia work up No results for input(s): VITAMINB12, FOLATE, FERRITIN, TIBC, IRON, RETICCTPCT in the last 72 hours. Urinalysis    Component Value Date/Time   COLORURINE YELLOW 10/27/2020 0957   APPEARANCEUR CLEAR 10/27/2020 0957   LABSPEC 1.005 10/27/2020 0957   PHURINE 5.0 10/27/2020 0957   GLUCOSEU NEGATIVE 10/27/2020 0957   HGBUR SMALL (A) 10/27/2020 0957   BILIRUBINUR NEGATIVE 10/27/2020 0957   KETONESUR NEGATIVE 10/27/2020 0957   PROTEINUR NEGATIVE 10/27/2020 0957   UROBILINOGEN 0.2 10/07/2014 1006   NITRITE NEGATIVE 10/27/2020 0957   LEUKOCYTESUR NEGATIVE 10/27/2020 0957   Sepsis Labs Invalid input(s): PROCALCITONIN,  WBC,  LACTICIDVEN Microbiology Recent Results (from the past 240 hour(s))  Blood Culture (routine x 2)     Status: None (Preliminary result)   Collection Time: 10/27/20  8:40 AM   Specimen: BLOOD  Result Value Ref Range Status   Specimen Description   Final    BLOOD RIGHT ANTECUBITAL Performed at [facility], [address] [place], [place]    Special Requests   Final    BOTTLES DRAWN AEROBIC AND ANAEROBIC Blood Culture adequate volume Performed at [facility], [address], [place], [place]    Culture   Final    NO GROWTH 1 DAY Performed at [facility], [address], [place], [place]    Report Status PENDING   Incomplete  Blood Culture (routine x 2)     Status: None (Preliminary result)   Collection Time: 10/27/20  8:50 AM   Specimen: BLOOD LEFT FOREARM  Result Value Ref Range Status   Specimen Description   Final  BLOOD LEFT FOREARM Performed at [facility], [address], [place], [place] [address]    Special Requests   Final    BOTTLES DRAWN AEROBIC AND ANAEROBIC Blood Culture adequate volume Performed at [facility], [address], [place], [place] [address]    Culture   Final    NO GROWTH 1 DAY Performed at [facility], [address] [place], [place] [address]    Report Status PENDING  Incomplete  Resp Panel by RT-PCR (Flu A&B, Covid) Nasopharyngeal Swab     Status: None   Collection Time: 10/27/20  8:55 AM   Specimen: Nasopharyngeal Swab; Nasopharyngeal(NP) swabs in vial transport medium  Result Value Ref Range Status   SARS Coronavirus 2 by RT PCR NEGATIVE NEGATIVE Final    Comment: (NOTE) SARS-CoV-2 target nucleic acids are NOT DETECTED.  The SARS-CoV-2 RNA is generally detectable in upper respiratory specimens during the acute phase of infection. The lowest concentration of SARS-CoV-2 viral copies this assay can detect is 138 copies/mL. A negative result does not preclude SARS-Cov-2 infection and should not be used as the sole basis for treatment or other patient management decisions. A negative result may occur with  improper specimen collection/handling, submission of specimen other than nasopharyngeal swab, presence of viral mutation(s) within the areas targeted by this assay, and inadequate number of viral copies(<138 copies/mL). A negative result must be combined with clinical observations, patient history, and epidemiological information. The expected result is Negative.  Fact Sheet for Patients:  BloggerCourse.com  Fact Sheet for Healthcare Providers:  SeriousBroker.it  This  test is no t yet approved or cleared by the [place] FDA and  has been authorized for detection and/or diagnosis of SARS-CoV-2 by FDA under an Emergency Use Authorization (EUA). This EUA will remain  in effect (meaning this test can be used) for the duration of the COVID-19 declaration under Section 564(b)(1) of the Act, 21 U.S.C.section 360bbb-3(b)(1), unless the authorization is terminated  or revoked sooner.       Influenza A by PCR NEGATIVE NEGATIVE Final   Influenza B by PCR NEGATIVE NEGATIVE Final    Comment: (NOTE) The Xpert Xpress SARS-CoV-2/FLU/RSV plus assay is intended as an aid in the diagnosis of influenza from Nasopharyngeal swab specimens and should not be used as a sole basis for treatment. Nasal washings and aspirates are unacceptable for Xpert Xpress SARS-CoV-2/FLU/RSV testing.  Fact Sheet for Patients: BloggerCourse.com  Fact Sheet for Healthcare Providers: SeriousBroker.it  This test is not yet approved or cleared by the [place] FDA and has been authorized for detection and/or diagnosis of SARS-CoV-2 by FDA under an Emergency Use Authorization (EUA). This EUA will remain in effect (meaning this test can be used) for the duration of the COVID-19 declaration under Section 564(b)(1) of the Act, 21 U.S.C. section 360bbb-3(b)(1), unless the authorization is terminated or revoked.  Performed at [facility], [address] [place], [place] [address]   Urine culture     Status: None   Collection Time: 10/27/20  9:57 AM   Specimen: In/Out Cath Urine  Result Value Ref Range Status   Specimen Description   Final    IN/OUT CATH URINE Performed at [facility], [address] [place], [place] [address]    Special Requests   Final    NONE Performed at [facility], [address] [place], [place] [address]    Culture   Final  NO  GROWTH Performed at [facility], [address] [place], [place] [address]    Report Status 10/28/2020 FINAL  Final     Time coordinating discharge: 35 minutes  SIGNED:   Glade LloydKshitiz Stanislaus Kaltenbach, MD  [facility] 10/29/2020, 10:03 AM

## 2020-11-01 LAB — CULTURE, BLOOD (ROUTINE X 2)
Culture: NO GROWTH
Culture: NO GROWTH
Special Requests: ADEQUATE
Special Requests: ADEQUATE

## 2020-11-10 DIAGNOSIS — Z79899 Other long term (current) drug therapy: Principal | ICD-10-CM

## 2020-11-10 DIAGNOSIS — Z944 Liver transplant status: Principal | ICD-10-CM

## 2020-11-16 DIAGNOSIS — Z79899 Other long term (current) drug therapy: Principal | ICD-10-CM

## 2020-11-16 DIAGNOSIS — Z944 Liver transplant status: Principal | ICD-10-CM

## 2020-11-23 DIAGNOSIS — Z79899 Other long term (current) drug therapy: Principal | ICD-10-CM

## 2020-11-23 DIAGNOSIS — Z944 Liver transplant status: Principal | ICD-10-CM

## 2020-11-30 DIAGNOSIS — Z79899 Other long term (current) drug therapy: Principal | ICD-10-CM

## 2020-11-30 DIAGNOSIS — Z944 Liver transplant status: Principal | ICD-10-CM

## 2020-12-07 DIAGNOSIS — Z79899 Other long term (current) drug therapy: Principal | ICD-10-CM

## 2020-12-07 DIAGNOSIS — Z944 Liver transplant status: Principal | ICD-10-CM

## 2020-12-14 DIAGNOSIS — Z79899 Other long term (current) drug therapy: Principal | ICD-10-CM

## 2020-12-14 DIAGNOSIS — Z944 Liver transplant status: Principal | ICD-10-CM

## 2020-12-21 DIAGNOSIS — Z79899 Other long term (current) drug therapy: Principal | ICD-10-CM

## 2020-12-21 DIAGNOSIS — Z944 Liver transplant status: Principal | ICD-10-CM

## 2020-12-28 DIAGNOSIS — Z79899 Other long term (current) drug therapy: Principal | ICD-10-CM

## 2020-12-28 DIAGNOSIS — Z944 Liver transplant status: Principal | ICD-10-CM

## 2020-12-31 DIAGNOSIS — Z79899 Other long term (current) drug therapy: Principal | ICD-10-CM

## 2020-12-31 DIAGNOSIS — Z944 Liver transplant status: Principal | ICD-10-CM

## 2020-12-31 MED ORDER — MYCOPHENOLATE SODIUM 360 MG TABLET,DELAYED RELEASE
ORAL_TABLET | Freq: Two times a day (BID) | ORAL | 3 refills | 90 days | Status: CP
Start: 2020-12-31 — End: ?

## 2021-01-04 DIAGNOSIS — Z79899 Other long term (current) drug therapy: Principal | ICD-10-CM

## 2021-01-04 DIAGNOSIS — Z944 Liver transplant status: Principal | ICD-10-CM

## 2021-01-05 DIAGNOSIS — Z79899 Other long term (current) drug therapy: Principal | ICD-10-CM

## 2021-01-05 DIAGNOSIS — Z944 Liver transplant status: Principal | ICD-10-CM

## 2021-01-05 MED ORDER — MYFORTIC 360 MG TABLET,DELAYED RELEASE
ORAL_TABLET | Freq: Two times a day (BID) | ORAL | 3 refills | 90.00000 days | Status: CP
Start: 2021-01-05 — End: 2021-01-05

## 2021-01-11 DIAGNOSIS — Z944 Liver transplant status: Principal | ICD-10-CM

## 2021-01-11 DIAGNOSIS — Z79899 Other long term (current) drug therapy: Principal | ICD-10-CM

## 2021-01-18 DIAGNOSIS — Z79899 Other long term (current) drug therapy: Principal | ICD-10-CM

## 2021-01-18 DIAGNOSIS — Z944 Liver transplant status: Principal | ICD-10-CM

## 2021-01-25 DIAGNOSIS — Z79899 Other long term (current) drug therapy: Principal | ICD-10-CM

## 2021-01-25 DIAGNOSIS — Z944 Liver transplant status: Principal | ICD-10-CM

## 2021-02-01 DIAGNOSIS — Z79899 Other long term (current) drug therapy: Principal | ICD-10-CM

## 2021-02-01 DIAGNOSIS — Z944 Liver transplant status: Principal | ICD-10-CM

## 2021-02-08 DIAGNOSIS — Z944 Liver transplant status: Principal | ICD-10-CM

## 2021-02-08 DIAGNOSIS — Z79899 Other long term (current) drug therapy: Principal | ICD-10-CM

## 2021-02-15 DIAGNOSIS — Z79899 Other long term (current) drug therapy: Principal | ICD-10-CM

## 2021-02-15 DIAGNOSIS — Z944 Liver transplant status: Principal | ICD-10-CM

## 2021-02-22 DIAGNOSIS — Z944 Liver transplant status: Principal | ICD-10-CM

## 2021-02-22 DIAGNOSIS — Z79899 Other long term (current) drug therapy: Principal | ICD-10-CM

## 2021-03-01 DIAGNOSIS — Z79899 Other long term (current) drug therapy: Principal | ICD-10-CM

## 2021-03-01 DIAGNOSIS — Z944 Liver transplant status: Principal | ICD-10-CM

## 2021-03-08 DIAGNOSIS — Z79899 Other long term (current) drug therapy: Principal | ICD-10-CM

## 2021-03-08 DIAGNOSIS — Z944 Liver transplant status: Principal | ICD-10-CM

## 2021-03-15 DIAGNOSIS — Z79899 Other long term (current) drug therapy: Principal | ICD-10-CM

## 2021-03-15 DIAGNOSIS — Z944 Liver transplant status: Principal | ICD-10-CM

## 2021-03-22 DIAGNOSIS — Z79899 Other long term (current) drug therapy: Principal | ICD-10-CM

## 2021-03-22 DIAGNOSIS — Z944 Liver transplant status: Principal | ICD-10-CM

## 2021-03-29 DIAGNOSIS — Z944 Liver transplant status: Principal | ICD-10-CM

## 2021-03-29 DIAGNOSIS — Z79899 Other long term (current) drug therapy: Principal | ICD-10-CM

## 2021-04-05 DIAGNOSIS — Z944 Liver transplant status: Principal | ICD-10-CM

## 2021-04-05 DIAGNOSIS — Z79899 Other long term (current) drug therapy: Principal | ICD-10-CM

## 2021-04-11 DIAGNOSIS — Z944 Liver transplant status: Principal | ICD-10-CM

## 2021-04-11 DIAGNOSIS — Z79899 Other long term (current) drug therapy: Principal | ICD-10-CM

## 2021-04-12 ENCOUNTER — Ambulatory Visit: Admit: 2021-04-12 | Discharge: 2021-04-12 | Payer: PRIVATE HEALTH INSURANCE

## 2021-04-12 DIAGNOSIS — Z944 Liver transplant status: Principal | ICD-10-CM

## 2021-04-12 DIAGNOSIS — Z23 Encounter for immunization: Principal | ICD-10-CM

## 2021-04-12 DIAGNOSIS — Z79899 Other long term (current) drug therapy: Principal | ICD-10-CM

## 2021-04-19 DIAGNOSIS — Z944 Liver transplant status: Principal | ICD-10-CM

## 2021-04-19 DIAGNOSIS — Z79899 Other long term (current) drug therapy: Principal | ICD-10-CM

## 2021-04-26 DIAGNOSIS — Z79899 Other long term (current) drug therapy: Principal | ICD-10-CM

## 2021-04-26 DIAGNOSIS — Z944 Liver transplant status: Principal | ICD-10-CM

## 2021-05-14 DIAGNOSIS — K769 Liver disease, unspecified: Principal | ICD-10-CM

## 2021-06-07 DIAGNOSIS — K769 Liver disease, unspecified: Principal | ICD-10-CM

## 2021-07-05 DIAGNOSIS — K769 Liver disease, unspecified: Principal | ICD-10-CM

## 2021-08-02 DIAGNOSIS — K769 Liver disease, unspecified: Principal | ICD-10-CM

## 2021-08-30 DIAGNOSIS — K769 Liver disease, unspecified: Principal | ICD-10-CM

## 2021-09-08 DIAGNOSIS — Z944 Liver transplant status: Principal | ICD-10-CM

## 2021-09-08 DIAGNOSIS — Z79899 Other long term (current) drug therapy: Principal | ICD-10-CM

## 2021-09-08 MED ORDER — PROGRAF 1 MG CAPSULE
ORAL_CAPSULE | Freq: Two times a day (BID) | ORAL | 3 refills | 90 days | Status: CP
Start: 2021-09-08 — End: ?

## 2021-09-27 DIAGNOSIS — K769 Liver disease, unspecified: Principal | ICD-10-CM

## 2021-10-25 DIAGNOSIS — K769 Liver disease, unspecified: Principal | ICD-10-CM

## 2021-10-30 DIAGNOSIS — Z944 Liver transplant status: Principal | ICD-10-CM

## 2021-10-31 MED ORDER — NIRMATRELVIR 300 MG (150 MG X2)-RITONAVIR 100 MG TABLET,DOSE PACK(EUA)
ORAL_TABLET | 0 refills | 0 days | Status: CP
Start: 2021-10-31 — End: ?

## 2021-11-22 DIAGNOSIS — K769 Liver disease, unspecified: Principal | ICD-10-CM

## 2021-12-20 DIAGNOSIS — K769 Liver disease, unspecified: Principal | ICD-10-CM

## 2022-01-04 DIAGNOSIS — Z944 Liver transplant status: Principal | ICD-10-CM

## 2022-01-04 DIAGNOSIS — Z79899 Other long term (current) drug therapy: Principal | ICD-10-CM

## 2022-01-04 MED ORDER — MYFORTIC 360 MG TABLET,DELAYED RELEASE
ORAL_TABLET | Freq: Two times a day (BID) | ORAL | 3 refills | 90 days | Status: CP
Start: 2022-01-04 — End: ?

## 2022-01-17 DIAGNOSIS — K769 Liver disease, unspecified: Principal | ICD-10-CM

## 2022-02-14 DIAGNOSIS — K769 Liver disease, unspecified: Principal | ICD-10-CM

## 2022-03-14 DIAGNOSIS — K769 Liver disease, unspecified: Principal | ICD-10-CM

## 2022-03-28 DIAGNOSIS — Z79899 Other long term (current) drug therapy: Principal | ICD-10-CM

## 2022-03-28 DIAGNOSIS — Z944 Liver transplant status: Principal | ICD-10-CM

## 2022-03-29 ENCOUNTER — Ambulatory Visit: Admit: 2022-03-29 | Discharge: 2022-03-29 | Payer: PRIVATE HEALTH INSURANCE

## 2022-03-29 DIAGNOSIS — Z944 Liver transplant status: Principal | ICD-10-CM

## 2022-03-29 DIAGNOSIS — Z796 Long-term use of immunosuppressant medication: Principal | ICD-10-CM

## 2022-03-29 DIAGNOSIS — Z23 Encounter for immunization: Principal | ICD-10-CM

## 2022-03-31 MED ORDER — MYFORTIC 180 MG TABLET,DELAYED RELEASE
ORAL_TABLET | Freq: Two times a day (BID) | ORAL | 3 refills | 90 days | Status: CP
Start: 2022-03-31 — End: ?

## 2022-04-25 DIAGNOSIS — Z944 Liver transplant status: Principal | ICD-10-CM

## 2022-04-25 DIAGNOSIS — Z79899 Other long term (current) drug therapy: Principal | ICD-10-CM

## 2022-05-23 DIAGNOSIS — Z79899 Other long term (current) drug therapy: Principal | ICD-10-CM

## 2022-05-23 DIAGNOSIS — Z944 Liver transplant status: Principal | ICD-10-CM

## 2022-06-20 DIAGNOSIS — Z944 Liver transplant status: Principal | ICD-10-CM

## 2022-06-20 DIAGNOSIS — Z79899 Other long term (current) drug therapy: Principal | ICD-10-CM

## 2022-07-18 DIAGNOSIS — Z79899 Other long term (current) drug therapy: Principal | ICD-10-CM

## 2022-07-18 DIAGNOSIS — Z944 Liver transplant status: Principal | ICD-10-CM

## 2022-08-15 DIAGNOSIS — Z944 Liver transplant status: Principal | ICD-10-CM

## 2022-08-15 DIAGNOSIS — Z79899 Other long term (current) drug therapy: Principal | ICD-10-CM

## 2022-08-22 ENCOUNTER — Ambulatory Visit
Admit: 2022-08-22 | Discharge: 2022-08-23 | Payer: PRIVATE HEALTH INSURANCE | Attending: Gastroenterology | Primary: Gastroenterology

## 2022-08-22 DIAGNOSIS — K51019 Ulcerative (chronic) pancolitis with unspecified complications: Principal | ICD-10-CM

## 2022-08-22 DIAGNOSIS — Z944 Liver transplant status: Principal | ICD-10-CM

## 2022-09-12 DIAGNOSIS — Z944 Liver transplant status: Principal | ICD-10-CM

## 2022-09-12 DIAGNOSIS — Z79899 Other long term (current) drug therapy: Principal | ICD-10-CM

## 2022-09-27 DIAGNOSIS — Z944 Liver transplant status: Principal | ICD-10-CM

## 2022-09-27 DIAGNOSIS — Z79899 Other long term (current) drug therapy: Principal | ICD-10-CM

## 2022-09-27 MED ORDER — PROGRAF 1 MG CAPSULE
ORAL_CAPSULE | Freq: Two times a day (BID) | ORAL | 3 refills | 90 days | Status: CP
Start: 2022-09-27 — End: ?

## 2022-10-10 DIAGNOSIS — Z79899 Other long term (current) drug therapy: Principal | ICD-10-CM

## 2022-10-10 DIAGNOSIS — Z944 Liver transplant status: Principal | ICD-10-CM

## 2022-11-07 DIAGNOSIS — Z79899 Other long term (current) drug therapy: Principal | ICD-10-CM

## 2022-11-07 DIAGNOSIS — Z944 Liver transplant status: Principal | ICD-10-CM

## 2022-12-05 DIAGNOSIS — Z79899 Other long term (current) drug therapy: Principal | ICD-10-CM

## 2022-12-05 DIAGNOSIS — Z944 Liver transplant status: Principal | ICD-10-CM

## 2023-01-02 DIAGNOSIS — Z944 Liver transplant status: Principal | ICD-10-CM

## 2023-01-02 DIAGNOSIS — Z79899 Other long term (current) drug therapy: Principal | ICD-10-CM

## 2023-01-30 DIAGNOSIS — Z944 Liver transplant status: Principal | ICD-10-CM

## 2023-01-30 DIAGNOSIS — Z79899 Other long term (current) drug therapy: Principal | ICD-10-CM

## 2023-03-16 DIAGNOSIS — Z79899 Other long term (current) drug therapy: Principal | ICD-10-CM

## 2023-03-16 DIAGNOSIS — Z944 Liver transplant status: Principal | ICD-10-CM

## 2023-03-20 DIAGNOSIS — Z0184 Encounter for antibody response examination: Principal | ICD-10-CM

## 2023-03-20 DIAGNOSIS — Z944 Liver transplant status: Principal | ICD-10-CM

## 2023-03-21 ENCOUNTER — Ambulatory Visit: Admit: 2023-03-21 | Discharge: 2023-03-21 | Payer: PRIVATE HEALTH INSURANCE

## 2023-03-21 DIAGNOSIS — Z944 Liver transplant status: Principal | ICD-10-CM

## 2023-03-21 DIAGNOSIS — Z23 Encounter for immunization: Principal | ICD-10-CM

## 2023-03-21 DIAGNOSIS — Z79899 Other long term (current) drug therapy: Principal | ICD-10-CM

## 2023-03-21 DIAGNOSIS — Z796 Long-term use of immunosuppressant medication: Principal | ICD-10-CM

## 2023-03-21 LAB — COMPREHENSIVE METABOLIC PANEL
ALBUMIN: 4.4 g/dL (ref 3.4–5.0)
ALKALINE PHOSPHATASE: 75 U/L (ref 46–116)
ALT (SGPT): 24 U/L (ref 10–49)
ANION GAP: 4 mmol/L — ABNORMAL LOW (ref 5–14)
AST (SGOT): 33 U/L (ref <=34)
BILIRUBIN TOTAL: 1 mg/dL (ref 0.3–1.2)
BLOOD UREA NITROGEN: 12 mg/dL (ref 9–23)
BUN / CREAT RATIO: 10
CALCIUM: 10 mg/dL (ref 8.7–10.4)
CHLORIDE: 109 mmol/L — ABNORMAL HIGH (ref 98–107)
CO2: 27 mmol/L (ref 20.0–31.0)
CREATININE: 1.18 mg/dL
EGFR CKD-EPI (2021) MALE: 72 mL/min/{1.73_m2} (ref >=60)
GLUCOSE RANDOM: 163 mg/dL (ref 70–179)
POTASSIUM: 5 mmol/L — ABNORMAL HIGH (ref 3.4–4.8)
PROTEIN TOTAL: 8.8 g/dL — ABNORMAL HIGH (ref 5.7–8.2)
SODIUM: 140 mmol/L (ref 135–145)

## 2023-03-21 LAB — BILIRUBIN, DIRECT: BILIRUBIN DIRECT: 0.3 mg/dL (ref 0.00–0.30)

## 2023-03-21 LAB — CBC W/ AUTO DIFF
BASOPHILS ABSOLUTE COUNT: 0 10*9/L (ref 0.0–0.1)
BASOPHILS RELATIVE PERCENT: 0.6 %
EOSINOPHILS ABSOLUTE COUNT: 0.1 10*9/L (ref 0.0–0.5)
EOSINOPHILS RELATIVE PERCENT: 1.6 %
HEMATOCRIT: 47.9 % (ref 39.0–48.0)
HEMOGLOBIN: 16.1 g/dL (ref 12.9–16.5)
LYMPHOCYTES ABSOLUTE COUNT: 1.6 10*9/L (ref 1.1–3.6)
LYMPHOCYTES RELATIVE PERCENT: 21 %
MEAN CORPUSCULAR HEMOGLOBIN CONC: 33.6 g/dL (ref 32.0–36.0)
MEAN CORPUSCULAR HEMOGLOBIN: 28.3 pg (ref 25.9–32.4)
MEAN CORPUSCULAR VOLUME: 84.2 fL (ref 77.6–95.7)
MEAN PLATELET VOLUME: 8.3 fL (ref 6.8–10.7)
MONOCYTES ABSOLUTE COUNT: 0.5 10*9/L (ref 0.3–0.8)
MONOCYTES RELATIVE PERCENT: 6.5 %
NEUTROPHILS ABSOLUTE COUNT: 5.2 10*9/L (ref 1.8–7.8)
NEUTROPHILS RELATIVE PERCENT: 70.3 %
PLATELET COUNT: 321 10*9/L (ref 150–450)
RED BLOOD CELL COUNT: 5.69 10*12/L — ABNORMAL HIGH (ref 4.26–5.60)
RED CELL DISTRIBUTION WIDTH: 14.2 % (ref 12.2–15.2)
WBC ADJUSTED: 7.5 10*9/L (ref 3.6–11.2)

## 2023-03-21 LAB — TACROLIMUS LEVEL: TACROLIMUS BLOOD: 5.1 ng/mL

## 2023-03-21 LAB — GAMMA GT: GAMMA GLUTAMYL TRANSFERASE: 135 U/L — ABNORMAL HIGH

## 2023-03-21 LAB — HEPATITIS B SURFACE ANTIBODY
HEPATITIS B SURFACE ANTIBODY QUANT: 26.79 m[IU]/mL — ABNORMAL HIGH (ref <8.00)
HEPATITIS B SURFACE ANTIBODY: REACTIVE — AB

## 2023-03-21 LAB — MAGNESIUM: MAGNESIUM: 1.8 mg/dL (ref 1.6–2.6)

## 2023-03-21 LAB — PHOSPHORUS: PHOSPHORUS: 2.8 mg/dL (ref 2.4–5.1)

## 2023-03-21 MED ORDER — MYFORTIC 180 MG TABLET,DELAYED RELEASE
ORAL_TABLET | Freq: Two times a day (BID) | ORAL | 3 refills | 90.00000 days | Status: CP
Start: 2023-03-21 — End: 2023-03-21
  Filled 2023-03-21: qty 60, 30d supply, fill #0

## 2023-03-21 MED ORDER — PROGRAF 1 MG CAPSULE
ORAL_CAPSULE | Freq: Two times a day (BID) | ORAL | 3 refills | 90.00000 days | Status: CP
Start: 2023-03-21 — End: 2023-03-21
  Filled 2023-03-21: qty 400, 22d supply, fill #0

## 2023-03-21 NOTE — Unmapped (Signed)
[facility]  03/21/2023    Reason for visit: Status post liver transplantation on 04/06/2005 (Liver) (17 years 11 months) for Primary Sclerosing Cholangitis: Ulcerative Colitis, seen for follow up    Assessment/Plan:    57 y.o. 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 He has not had any rejection or biliary complications after transplant.  He underwent colectomy + IPAA for his UC in 2012.     Liver replaced by transplant  Immunosuppression:   -Continue FK 9mg  BID with goal 3-5  -continue Myfortic 180 mg BID. Dose decreased at last years visit but patient did not get routine lab work. If he has routine lab work done and enzymes stable for 6 months can discontinue  -standing labs per protocol every 3 months  -Discussed need for yearly full body skin examination, as there is an increased risk of skin cancer with immunosuppression.   -Continue routine age-related cancer screening.     Ulcerative Colitis: s/p TAC with IPAA in 2012. No GI issues  -Continue to follow up with Dr Stevphen Rochester as directed    Metabolic syndrome:  -discussed importance of controlling diabetes, hypertension and hyperlipidemia. He is being followed closely by PCP       Vaccinations: We recommend that patients have vaccinations to prevent various infections that can occur, especially in the setting of immunosuppression. The following vaccinations should be given:  - Hepatitis A: HAV IgG+  -Hepatitis B:Heplisav series 04/2021, 03/29/2022, check immunity  -Influenza (yearly): due in fall 2024  -Pneumococcal: PPSV23 04/2017, PCV-13 02/2017. Prevnar 20 given today  -Zoster: Shingrix x 2 given in 2018-2019  -SARS-CoV-2:  Pfizer x 3, bivalent 03/2022, recommend updated vaccine    Return in about 1 year (around 03/20/2024) for annual transplant appointment.    Priscille Heidelberg, NP  [facility]  Subjective   History of Present Illness   Accompanied by: N/A (unaccompanied)    57 y.o. 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 Hx elevated enzymes in 08/2020 after missing 5-10 doses in 30 days, short burst of steroids and resuming medicines enzymes normalized. No history of rejection or biliary issues    Interval history:   His last visit was on 03/29/2022. In the interim, he re-established care with IBD Gastroenterology, Dr Stevphen Rochester. He has not had any changes in his health.     Objective   Physical Exam   Vital Signs: BP 125/90 (BP Site: R Arm, BP Position: Sitting, BP Cuff Size: Medium)  - Pulse 66  - Temp 37.1 ??C (98.7 ??F) (Tympanic)  - Resp 18  - Ht 188 cm (6' 2) Comment: reported - Wt (!) 109.6 kg (241 lb 9.6 oz)  - SpO2 98%  - BMI 31.02 kg/m??   Constitutional: He is in no apparent distress, with good coloring  HENT: conjunctiva clear, anicteric, nares without discharge, neck supple  CV: Regular rate and rhythm  Lung: respirations even and unlabored  Abdomen: soft, non-distended, non-tender. No palpable ascites.   Extremities: No edema, well perfused  Neuro: No focal deficits. No asterixis.   Mental Status: Thought organized, appropriate affect, engaged in conversation        Patient is taking immunosuppressive medications due to liver transplantation and requires monitoring of renal function for signs of toxicity  I personally spent 30 minutes face-to-face and non-face-to-face in the care of this patient, which includes all pre, intra, and  post visit time on the date of service

## 2023-03-21 NOTE — Unmapped (Signed)
Per clinic messaging, patient just got supply of transplant meds at hospital today, will use [facility] for refills.  Messaged triage team for test claims, patient enrolled and will be followed at [facility] pending paid claims, clinic aware.

## 2023-03-21 NOTE — Unmapped (Signed)
Patient seen for annual post liver transplant visit by NP Harms.  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  Followed for primary care by PCP Ehinger (retiring this fall) who manages BP and glucose, obtains routine labs @ local [facility] (reinforced importance of completion of these q3 months), recent insurance changes thus needs new pharamcy to fill Brand Myfortic and Brand Prograf (using co-pay cards) - obtained supply @ [facility] at end of visit (he provided them with co-pay card info) scripted to [facility] for future fills.    Denies N/D/F - occasional diarrhea.  Follows with Dr. Stevphen Rochester for IBD due for pouchoscopy - provided GIP # to secure appt for this.      Encouraged him to establish with local dermatologist for skin surveillance - counseled on risk given immunosuppressed status.    Obtained prevnar 20 vaccination today, hep b serology pending to ensure immunity from previous vaccination completion.      5 minutes of education spent on post transplant care topics.

## 2023-03-21 NOTE — Unmapped (Addendum)
The following medications are onboarded in this note:  Prograf $0/22ds not part b per cop fill  Myfortic $40/30ds not part b per cop fill- however patient now has copay card to bring copay to $0 as well on this med (he is aware we cannot test copay card until 6/1 when ins will pay for myfortic as primary, notes added that if NOT $0 to let pt know before sending anything out)      Per 5/14 triage test claims:  Prograf RTS 5/27 and Myfortic RTS 6/1.       [facility]   Patient Onboarding/Medication Counseling    Shawn Huerta is a 57 y.o. male with liver transplant who I am counseling today on continuation of therapy.  I am speaking to the patient.    Was a Nurse, learning disability used for this call? No    Verified patient's date of birth / HIPAA.    Specialty medication(s) to be sent: n/a      Non-specialty medications/supplies to be sent: n/a      Medications not needed at this time: n/a         The patient declined counseling on medication administration, missed dose instructions, goals of therapy, side effects and monitoring parameters, warnings and precautions, drug/food interactions, and storage, handling precautions, and disposal because they have taken the medication previously. The information in the declined sections below are for informational purposes only and was not discussed with patient.   Prograf (tacrolimus)    Medication & Administration     Dosage: take 9 capsules (9mg  total) by mouth twice daily     Administration:   May take with or without food  Take 12 hours apart    Adherence/Missed dose instructions:  Take a missed dose as soon as you think about it.  If it is close to the time for your next dose, skip the missed dose and go back to your normal time.  Do not take 2 doses at the same time or extra doses.    Goals of Therapy     To prevent organ rejection    Side Effects & Monitoring Parameters     Common side effects  Dizziness  Fatigue  Headache  Stuffy nose or sore throat  Nausea, vomiting, stomach pain, diarrhea, constipation  Heartburn  Back or joint pain  Increased risk of infection    The following side effects should be reported to the provider:  Allergic reaction  Kidney issues (change in quantity or urine passed, blood in urine, or weight gain)  High blood pressure (dizziness, change in eyesight, headache)  Electrolyte issues (change in mood, confusion, muscle pain, or weakness)  Abnormal breathing  Shakiness  Unexplained bleeding or bruising (gums bleeding, blood in urine, nosebleeds, any abnormal bleeding)  Signs of infection (fever, cough, wounds that will not heal)  Skin changes (sores, paleness, new or changed bumps or moles)    Monitoring Parameters  Renal function  Liver function  Glucose levels  Blood pressure  Tacrolimus trough levels  Cardiac monitoring (for QT prolongation)      Contraindications, Warnings, & Precautions     Black Box Warning: Infections - immunosuppressant agents increase the risk of infection that may lead to hospitalization or death  Black Box Warning: Malignancy - immunosuppressant agents may be associated with the development of malignancies that may lead to hospitalization or death  Limit or avoid sun and ultraviolet light exposure, use appropriate sun protection  Myocardial hypertrophy -avoid use in  patients with congenital long QT syndrome  Diabetes mellitus - the risk for new-onset diabetes and insulin-dependent post-transplant diabetes mellitus is increased with tacrolimus use after transplantation  GI perforation  Hyperkalemia  Hypertension  Nephrotoxicity  Neurotoxicity  This is a narrow therapeutic index drug. Do not switch manufacturers without first talking to the provider.    Drug/Food Interactions     Medication list reviewed in Epic. The patient was instructed to inform the care team before taking any new medications or supplements.  No interactions noted that clinic is not already monitoring .   Avoid alcohol  Avoid grapefruit or grapefruit juice  Avoid live vaccines    Storage, Handling Precautions, & Disposal     Store at room temperature  Keep away from children and pets  The patient declined counseling on medication administration, missed dose instructions, goals of therapy, side effects and monitoring parameters, warnings and precautions, drug/food interactions, and storage, handling precautions, and disposal because they have taken the medication previously. The information in the declined sections below are for informational purposes only and was not discussed with patient.   Myfortic (mycophenolic acid)    Medication & Administration     Dosage:   Take 1 tablet (180mg ) by mouth twice daily    Administration:   Take with or without food, although taking with food helps minimize GI side effects.  Swallow the pills whole, do not chew or crush    Adherence/Missed dose instructions:  Take a missed dose as soon as you think about it.  If it is less than 2 hours until your next dose, skip the missed dose and go back to your normal time.  Do not take 2 doses at the same time or extra doses.    Goals of Therapy     To prevent organ rejection    Side Effects & Monitoring Parameters     Common side effects  Back or joint pain  Constipation  Headache/dizziness  Not hungry  Stomach pain, diarrhea, constipation, gas, upset stomach, vomiting, nausea  Feeling tired or weak  Shakiness  Trouble sleeping  Increased risk of infection    The following side effects should be reported to the provider:  Allergic reaction  High blood sugar (confusion, feeling sleepy, more thirst, more hungry, passing urine more often, flushing, fast breathing, or breath that smells like fruit)  Electrolyte issues (mood changes, confusion, muscle pain or weakness, a heartbeat that does not feel normal, seizures, not hungry, or very bad upset stomach or throwing up)  High or low blood pressure (bad headache or dizziness, passing out, or change in eyesight)  Kidney issues (unable to pass urine, change in how much urine is passed, blood in the urine, or a big weight gain)  Skin (oozing, heat, swelling, redness, or pain), UTI and other infections   Chest pain or pressure  Abnormal heartbeat  Unexplained bleeding or bruising  Abnormal burning, numbness, or tingling  Muscle cramps,  Yellowing of skin or eyes    Monitoring parameters  Pregnancy test initially prior to treatment and 8-10 days later then as needed)  CBC weekly for first month then twice monthly for next 2 months, then monthly)  Monitor Renal and liver functions  Signs of organ rejection    Contraindications, Warnings, & Precautions     *This is a REMS drug and an FDA-approved patient medication guide will be printed with each dispensation  Black Box Warning: Infections   Black Box Warning: Lymphoproliferative disorders - risk  of development of lymphoma and skin malignancy is increased  Black Box Warning: Use during pregnancy is associated with increased risks of first trimester pregnancy loss and congenital malformations.   Black Box Warning: Females of reproductive potential should use contraception during treatment and for 6 weeks after therapy is discontinued  Is patient using an effective method of contraception? Not Applicable  If yes, method of contraception:  patient is male  CNS depression  New or reactivated viral infections  Neutropenia  Male patients and/or their male partners should use effective contraception during treatment of the male patient and for at least 3 months after last dose.  Breastfeeding is not recommended during therapy and for 6 weeks after last dose    Drug/Food Interactions     Medication list reviewed in Epic. The patient was instructed to inform the care team before taking any new medications or supplements.  No interactions noted that clinic is not already monitoring .   Do not take Echinacea while on this medication  Check with your doctor before getting any vaccinations (live or inactivated)    Storage, Handling Precautions, & Disposal     Store at room temperature  Keep away from children and pets  This drug is considered hazardous and should be handled as little as possible.  Wash hands before and after touching pills. If someone else helps with medication administration, they should wear gloves.      Current Medications (including OTC/herbals), Comorbidities and Allergies     Current Outpatient Medications   Medication Sig Dispense Refill    allopurinol (ZYLOPRIM) 100 MG tablet Take 1 tablet (100 mg total) by mouth daily.      amLODIPine (NORVASC) 10 MG tablet Take 1 tablet (10 mg total) by mouth daily. for blood pressure      aspirin (ECOTRIN) 81 MG tablet Take 1 tablet (81 mg total) by mouth daily.      glimepiride (AMARYL) 1 MG tablet TAKE 1 TABLET BY MOUTH ONCE DAILY WITH BREAKFAST OR THE FIRST MAIN MEAL OF THE DAY FOR DIABETES      hydrALAZINE (APRESOLINE) 25 MG tablet TAKE 1 TABLET BY MOUTH TWICE DAILY FOR BLOOD PRESSURE      metFORMIN (GLUCOPHAGE) 500 MG tablet Take 2 tablets (1,000 mg total) by mouth in the morning.      metoprolol succinate (TOPROL-XL) 50 MG 24 hr tablet Take 1 tablet (50 mg total) by mouth daily.      multivitamin capsule Take 1 capsule by mouth daily.      MYFORTIC 180 mg EC tablet Take 1 tablet (180 mg total) by mouth two (2) times a day. 180 tablet 3    PROGRAF 1 mg capsule Take 9 capsules (9 mg total) by mouth two (2) times a day. 1620 capsule 3     No current facility-administered medications for this visit.       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    Appropriateness of Therapy     Acute infections noted within Epic:  No active infections  Patient reported infection: None    Is medication and dose appropriate based on diagnosis and infection status? Yes    Prescription has been clinically reviewed: Yes      Baseline Quality of Life Assessment      How many days over the past month did your transplant  keep you from your normal activities? For example, brushing your teeth or getting up in the morning. 0    Financial Information     Medication Assistance provided: referral will be entered for myfortic    Anticipated copay of $0/22ds prograf not part b and $0/30ds myfortic not part b per cop fills/myfortic card added (not that pt does not ok $40 myfortic if card doesn't work, he will need a call to ok any copay over $0) reviewed with patient. Verified delivery address.    Delivery Information     Scheduled delivery date: na- pt will check supply and call back if needed    Expected start date: patient is currently already taking both prograf and myfortic      Medication will be delivered via  n/a  to the  n/a  address in Epic [place].  This shipment will not require a signature.      Explained the services we provide at [facility] and that each month we would call to set up refills.  Stressed importance of returning phone calls so that we could ensure they receive their medications in time each month.  Informed patient that we should be setting up refills 7-10 days prior to when they will run out of medication.  A pharmacist will reach out to perform a clinical assessment periodically.  Informed patient that a welcome packet, containing information about our pharmacy and other support services, a Notice of Privacy Practices, and a drug information handout will be sent.      The patient or caregiver noted above participated in the development of this care plan and knows that they can request review of or adjustments to the care plan at any time.      Patient or caregiver verbalized understanding of the above information as well as how to contact the pharmacy at [phone] option 4 with any questions/concerns.  The pharmacy is open Monday through Friday 8:30am-4:30pm.  A pharmacist is available 24/7 via pager to answer any clinical questions they may have.    Patient Specific Needs     Does the patient have any physical, cognitive, or cultural barriers? No    Does the patient have adequate living arrangements? (i.e. the ability to store and take their medication appropriately) Yes    Did you identify any home environmental safety or security hazards? No    Patient prefers to have medications discussed with  Patient     Is the patient or caregiver able to read and understand education materials at a high school level or above? Yes    Patient's primary language is  English     Is the patient high risk? Yes, patient is taking a REMS drug. Medication is dispensed in compliance with REMS program    SOCIAL DETERMINANTS OF HEALTH     At the [facility], we have learned that life circumstances - like trouble affording food, housing, utilities, or transportation can  affect the health of many of our patients.   That is why we wanted to ask: are you currently experiencing any life circumstances that are negatively impacting your health and/or quality of life? No    Social Determinants of Psychologist, prison and probation services Strain: Not on file   Internet Connectivity: Not on file   Food Insecurity: Not on file   Tobacco Use: Low Risk  (03/21/2023)    Patient History     Smoking Tobacco Use: Never     Smokeless Tobacco Use: Never     Passive Exposure: Past   Housing/Utilities: Not on file   Alcohol Use: Not on file   Transportation Needs: Not on file   Substance Use: Not on file   Health Literacy: Not on file   Physical Activity: Not on file   Interpersonal Safety: Not on file   Stress: Not on file   Intimate Partner Violence: Not on file   Depression: Not on file   Social Connections: Not on file       Would you be willing to receive help with any of the needs that you have identified today? Not applicable       Thad Ranger, PharmD  [facility] Specialty Pharmacist

## 2023-03-21 NOTE — Unmapped (Signed)
Addended by: Patsey Berthold on: 03/21/2023 03:48 PM     Modules accepted: Orders

## 2023-03-21 NOTE — Unmapped (Signed)
Per provider, the patient received the Prevnar 20 vaccine.  Patient ID verified with name and date of birth.  All screening questions were answered.  Vaccine(s) were administered as ordered.  See immunization history for documentation.  Patient tolerated the injection(s) well with no issues noted.  Vaccine Information sheet given to the patient.

## 2023-03-21 NOTE — Unmapped (Signed)
-   Please continue routine labs every 3 months  - Please call GI Procedures to schedule pouch exam @ [phone] - option #1 for appts then option #2 for procedures  -Consider establishing with dermatology  - You obtained Prevnar 20 (pneumonia vaccine) today

## 2023-03-28 NOTE — Unmapped (Signed)
5/17: myfortic copay card entered on patient's profile. Notes added that when we are able to bill if cost is not $0 to let patient know. Pt is aware if cost is not $0 we will need credit card on file for billing -ef      [facility] Clinical Assessment & Refill Coordination Note    Shawn Huerta, DOB: Mar 31, 1966  Phone: [phone] (home)     All above HIPAA information was verified with patient.     Was a Nurse, learning disability used for this call? No    Specialty Medication(s):   Transplant: Myfortic 180mg  and Prograf 1mg      Current Outpatient Medications   Medication Sig Dispense Refill    allopurinol (ZYLOPRIM) 100 MG tablet Take 1 tablet (100 mg total) by mouth daily.      amLODIPine (NORVASC) 10 MG tablet Take 1 tablet (10 mg total) by mouth daily. for blood pressure      aspirin (ECOTRIN) 81 MG tablet Take 1 tablet (81 mg total) by mouth daily.      glimepiride (AMARYL) 1 MG tablet TAKE 1 TABLET BY MOUTH ONCE DAILY WITH BREAKFAST OR THE FIRST MAIN MEAL OF THE DAY FOR DIABETES      hydrALAZINE (APRESOLINE) 25 MG tablet TAKE 1 TABLET BY MOUTH TWICE DAILY FOR BLOOD PRESSURE      metFORMIN (GLUCOPHAGE) 500 MG tablet Take 2 tablets (1,000 mg total) by mouth in the morning.      metoprolol succinate (TOPROL-XL) 50 MG 24 hr tablet Take 1 tablet (50 mg total) by mouth daily.      multivitamin capsule Take 1 capsule by mouth daily.      MYFORTIC 180 mg EC tablet Take 1 tablet (180 mg total) by mouth two (2) times a day. 180 tablet 3    PROGRAF 1 mg capsule Take 9 capsules (9 mg total) by mouth two (2) times a day. 1620 capsule 3     No current facility-administered medications for this visit.        Changes to medications: Shawn Huerta reports no changes at this time.    Allergies   Allergen Reactions    Lisinopril Swelling    Doxazosin      Other reaction(s): ineffective for BP (02/2021)    Hydrochlorothiazide      Other reaction(s): ineffective for BP (02/2021)    Spironolactone      Other reaction(s): ED (08/2017)    Valsartan      Other reaction(s): angioedema (06/2017)       Changes to allergies: No    SPECIALTY MEDICATION ADHERENCE     Prograf 1mg   : 15 days of medicine on hand   Myfortic 180mg   : 20 days of medicine on hand       Medication Adherence    Patient reported X missed doses in the last month: 0  Specialty Medication: prograf 1mg   Patient is on additional specialty medications: Yes  Additional Specialty Medications: Myfortic 180mg   Patient Reported Additional Medication X Missed Doses in the Last Month: 0          Specialty medication(s) dose(s) confirmed: Regimen is correct and unchanged.     Are there any concerns with adherence? No    Adherence counseling provided? Not needed    CLINICAL MANAGEMENT AND INTERVENTION      Clinical Benefit Assessment:    Do you feel the medicine is effective or helping your condition? Yes    Clinical Benefit counseling  provided? Not needed    Adverse Effects Assessment:    Are you experiencing any side effects? No    Are you experiencing difficulty administering your medicine? No    Quality of Life Assessment:    Quality of Life    Rheumatology  Oncology  Dermatology  Cystic Fibrosis          How many days over the past month did your transplant  keep you from your normal activities? For example, brushing your teeth or getting up in the morning. 0    Have you discussed this with your provider? Not needed    Acute Infection Status:    Acute infections noted within Epic:  No active infections  Patient reported infection: None    Therapy Appropriateness:    Is therapy appropriate and patient progressing towards therapeutic goals? Yes, therapy is appropriate and should be continued    DISEASE/MEDICATION-SPECIFIC INFORMATION      N/A    Solid Organ Transplant: Not Applicable    PATIENT SPECIFIC NEEDS     Does the patient have any physical, cognitive, or cultural barriers? No    Is the patient high risk? Yes, patient is taking a REMS drug. Medication is dispensed in compliance with REMS program    Did the patient require a clinical intervention? No    Does the patient require physician intervention or other additional services (i.e., nutrition, smoking cessation, social work)? No    SOCIAL DETERMINANTS OF HEALTH     At the [facility], we have learned that life circumstances - like trouble affording food, housing, utilities, or transportation can affect the health of many of our patients.   That is why we wanted to ask: are you currently experiencing any life circumstances that are negatively impacting your health and/or quality of life? No    Social Determinants of Psychologist, prison and probation services Strain: Not on file   Internet Connectivity: Not on file   Food Insecurity: Not on file   Tobacco Use: Low Risk  (03/21/2023)    Patient History     Smoking Tobacco Use: Never     Smokeless Tobacco Use: Never     Passive Exposure: Past   Housing/Utilities: Not on file   Alcohol Use: Not on file   Transportation Needs: Not on file   Substance Use: Not on file   Health Literacy: Not on file   Physical Activity: Not on file   Interpersonal Safety: Not on file   Stress: Not on file   Intimate Partner Violence: Not on file   Depression: Not on file   Social Connections: Not on file       Would you be willing to receive help with any of the needs that you have identified today? Not applicable       SHIPPING     Specialty Medication(s) to be Shipped:   N/a    Other medication(s) to be shipped: No additional medications requested for fill at this time     Changes to insurance: No    Delivery Scheduled: Patient declined refill at this time due to supply on hand, wants call back in 1 week to get both meds scheduled out together.     Medication will be delivered via  n/a  to the confirmed  n/a  address in [facility].    The patient will receive a drug information handout for each medication shipped and additional FDA Medication Guides as required.  Verified that  patient has previously received a Conservation officer, historic buildings and a Surveyor, mining.    The patient or caregiver noted above participated in the development of this care plan and knows that they can request review of or adjustments to the care plan at any time.      All of the patient's questions and concerns have been addressed.    Thad Ranger, PharmD   [facility] Specialty Pharmacist

## 2023-04-03 NOTE — Unmapped (Addendum)
5/27: this medication has already been onboarded and first clinical assessment done at [facility] - see epic notes from 5/14 and 5/21. No dosage change since last assessment-ef      [facility] Specialty Medication Onboarding    Specialty Medication: Prograf 1 mg capsules  Prior Authorization: Not Required   Financial Assistance: Yes - copay card approved as secondary   Final Copay/Day Supply: $0 / 30    Insurance Restrictions: Yes - max 1 month supply     Notes to Pharmacist:   Credit Card on File: not applicable    The triage team has completed the benefits investigation and has determined that the patient is able to fill this medication at [facility]. Please contact the patient to complete the onboarding or follow up with the prescribing physician as needed.

## 2023-04-11 DIAGNOSIS — Z944 Liver transplant status: Principal | ICD-10-CM

## 2023-04-12 NOTE — Unmapped (Addendum)
6/5: this medication was previously onboarded and first clinical assessment already completed- see epic notes from 5/14 and 5/21. No dosage change since last clinical assessment. Refill call already made out, added note to it to let pt know about $40 without copay card/signing up for copay card online -ef        [facility] Specialty Medication Onboarding    Specialty Medication: Myfortic 180mg  EC tablet  Prior Authorization: Not Required   Financial Assistance: copay assistance currently not available  Final Copay/Day Supply: $40 / 30 days    Insurance Restrictions: Yes - max 1 month supply     Notes to Pharmacist: Copay cards are able to be signed up for on the website, but they all reject for missing or invalid cardholder ID. Not able to contact representatives to troubleshoot at the moment.  Credit Card on File: no    The triage team has completed the benefits investigation and has determined that the patient is able to fill this medication at [facility]. Please contact the patient to complete the onboarding or follow up with the prescribing physician as needed.

## 2023-04-19 MED FILL — PROGRAF 1 MG CAPSULE: ORAL | 30 days supply | Qty: 540 | Fill #0

## 2023-04-19 MED FILL — MYFORTIC 180 MG TABLET,DELAYED RELEASE: ORAL | 30 days supply | Qty: 60 | Fill #0

## 2023-05-15 DIAGNOSIS — Z79899 Other long term (current) drug therapy: Principal | ICD-10-CM

## 2023-05-15 DIAGNOSIS — Z944 Liver transplant status: Principal | ICD-10-CM

## 2023-05-24 NOTE — Unmapped (Signed)
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     Was a Nurse, learning disability used for this call? No    Completed refill call assessment today to schedule patient's medication shipment from the [facility] [phone]).  All relevant notes have been reviewed.     Specialty medication(s) and dose(s) confirmed: Regimen is correct and unchanged.   Changes to medications: Dakarri reports no changes at this time.  Changes to insurance: No  New side effects reported not previously addressed with a pharmacist or physician: None reported  Questions for the pharmacist: No    Confirmed patient received a Conservation officer, historic buildings and a Surveyor, mining with first shipment. The patient will receive a drug information handout for each medication shipped and additional FDA Medication Guides as required.       DISEASE/MEDICATION-SPECIFIC INFORMATION        N/A    SPECIALTY MEDICATION ADHERENCE     Medication Adherence    Patient reported X missed doses in the last month: 0  Specialty Medication: PROGRAF 1 mg capsule (tacrolimus)  Patient is on additional specialty medications: Yes  Additional Specialty Medications: MYFORTIC 180 mg EC tablet (mycophenolate)  Patient Reported Additional Medication X Missed Doses in the Last Month: 0  Patient is on more than two specialty medications: No  Any gaps in refill history greater than 2 weeks in the last 3 months: no  Demonstrates understanding of importance of adherence: yes  Informant: patient  Confirmed plan for next specialty medication refill: delivery by pharmacy  Refills needed for supportive medications: not needed          Refill Coordination    Has the Patients' Contact Information Changed: No  Is the Shipping Address Different: No         Were doses missed due to medication being on hold? No    MYFORTIC 180  mg: 7 days of medicine on hand   PROGRAF 1  mg: 2 days of medicine on hand       REFERRAL TO PHARMACIST     Referral to the pharmacist: Not needed      [facility]     Shipping address confirmed in Epic.       Delivery Scheduled: Yes, Expected medication delivery date: 05/26/2023.     Medication will be delivered via UPS to the prescription address in Epic WAM.    Kerby Less   [facility] Specialty Technician

## 2023-05-25 MED FILL — MYFORTIC 180 MG TABLET,DELAYED RELEASE: ORAL | 30 days supply | Qty: 60 | Fill #1

## 2023-05-25 MED FILL — PROGRAF 1 MG CAPSULE: ORAL | 30 days supply | Qty: 540 | Fill #1

## 2023-06-12 DIAGNOSIS — Z79899 Other long term (current) drug therapy: Principal | ICD-10-CM

## 2023-06-12 DIAGNOSIS — Z944 Liver transplant status: Principal | ICD-10-CM

## 2023-06-30 MED FILL — MYFORTIC 180 MG TABLET,DELAYED RELEASE: ORAL | 30 days supply | Qty: 60 | Fill #2

## 2023-06-30 MED FILL — PROGRAF 1 MG CAPSULE: ORAL | 30 days supply | Qty: 540 | Fill #2

## 2023-06-30 NOTE — Unmapped (Signed)
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     Was a Nurse, learning disability used for this call? No    Completed refill call assessment today to schedule patient's medication shipment from the [facility] [phone]).  All relevant notes have been reviewed.     Specialty medication(s) and dose(s) confirmed: Regimen is correct and unchanged.   Changes to medications: Baris reports no changes at this time.  Changes to insurance: No  New side effects reported not previously addressed with a pharmacist or physician: None reported  Questions for the pharmacist: No    Confirmed patient received a Conservation officer, historic buildings and a Surveyor, mining with first shipment. The patient will receive a drug information handout for each medication shipped and additional FDA Medication Guides as required.       DISEASE/MEDICATION-SPECIFIC INFORMATION        N/A    SPECIALTY MEDICATION ADHERENCE     Medication Adherence    Patient reported X missed doses in the last month: 0  Specialty Medication: MYFORTIC 180 mg EC tablet (mycophenolate)  Patient is on additional specialty medications: Yes  Additional Specialty Medications: PROGRAF 1 mg capsule (tacrolimus)  Patient Reported Additional Medication X Missed Doses in the Last Month: 0  Patient is on more than two specialty medications: No              Were doses missed due to medication being on hold? No    Myfortic 180 mg: 2 days of medicine on hand   Prograf 1 mg: 2 days of medicine on hand       REFERRAL TO PHARMACIST     Referral to the pharmacist: Not needed      [facility]     Shipping address confirmed in Epic.       Delivery Scheduled: Yes, Expected medication delivery date: 07/03/23.     Medication will be delivered via UPS to the prescription address in Epic WAM.    Shawn Huerta   [facility] Specialty Technician

## 2023-07-10 DIAGNOSIS — Z79899 Other long term (current) drug therapy: Principal | ICD-10-CM

## 2023-07-10 DIAGNOSIS — Z944 Liver transplant status: Principal | ICD-10-CM

## 2023-08-03 MED FILL — MYFORTIC 180 MG TABLET,DELAYED RELEASE: ORAL | 30 days supply | Qty: 60 | Fill #3

## 2023-08-03 MED FILL — PROGRAF 1 MG CAPSULE: ORAL | 30 days supply | Qty: 540 | Fill #3

## 2023-08-03 NOTE — Unmapped (Signed)
Patient was notified of operational disruptions. 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     Was a Nurse, learning disability used for this call? No    Completed refill call assessment today to schedule patient's medication shipment from the [facility]  [phone]).  All relevant notes have been reviewed.     Specialty medication(s) and dose(s) confirmed: Regimen is correct and unchanged.   Changes to medications: Jacey reports no changes at this time.  Changes to insurance: No  New side effects reported not previously addressed with a pharmacist or physician: None reported  Questions for the pharmacist: No    Confirmed patient received a Conservation officer, historic buildings and a Surveyor, mining with first shipment. The patient will receive a drug information handout for each medication shipped and additional FDA Medication Guides as required.       DISEASE/MEDICATION-SPECIFIC INFORMATION        N/A    SPECIALTY MEDICATION ADHERENCE     Medication Adherence    Patient reported X missed doses in the last month: 0  Specialty Medication: MYFORTIC 180 mg EC tablet (mycophenolate)  Patient is on additional specialty medications: Yes  Additional Specialty Medications: PROGRAF 1 mg capsule (tacrolimus)  Patient Reported Additional Medication X Missed Doses in the Last Month: 0  Patient is on more than two specialty medications: No  Any gaps in refill history greater than 2 weeks in the last 3 months: no  Demonstrates understanding of importance of adherence: yes  Informant: patient  Reliability of informant: reliable  Provider-estimated medication adherence level: good  Patient is at risk for Non-Adherence: No  Reasons for non-adherence: no problems identified  Confirmed plan for next specialty medication refill: delivery by pharmacy  Refills needed for supportive medications: not needed          Refill Coordination    Has the Patients' Contact Information Changed: No  Is the Shipping Address Different: No         Were doses missed due to medication being on hold? No    Prograf  1 mg: 3 days of medicine on hand   myfortic 180 mg: 3 days of medicine on hand       REFERRAL TO PHARMACIST     Referral to the pharmacist: Not needed      [facility]     Shipping address confirmed in Epic.       Delivery Scheduled: Yes, Expected medication delivery date: 09/27.     Medication will be delivered via UPS to the prescription address in Epic WAM.    [facility] Pharm[facility]  Specialty Technician

## 2023-08-07 DIAGNOSIS — Z944 Liver transplant status: Principal | ICD-10-CM

## 2023-08-07 DIAGNOSIS — Z79899 Other long term (current) drug therapy: Principal | ICD-10-CM

## 2023-09-01 NOTE — Unmapped (Signed)
The [facility] has made a second and final attempt to reach this patient to refill the following medication:MYFORTIC 180 mg EC tablet (mycophenolate),PROGRAF 1 mg capsule (tacrolimus).      We have left voicemails on the following phone numbers: [phone], have sent a MyChart message, and have sent a text message to the following phone numbers: [phone] .    Dates contacted: 10/17,10/25  Last scheduled delivery: 08/03/23    The patient may be at risk of non-compliance with this medication. The patient should call the [facility] at [phone]  Option 4, then Option 4: Infectious Disease, Transplant to refill medication.    Jorje Guild Specialty and Home Delivery Oncologist

## 2023-09-04 DIAGNOSIS — Z944 Liver transplant status: Principal | ICD-10-CM

## 2023-09-04 DIAGNOSIS — Z79899 Other long term (current) drug therapy: Principal | ICD-10-CM

## 2023-09-04 NOTE — Unmapped (Signed)
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     Was a Nurse, learning disability used for this call? No    Completed refill call assessment today to schedule patient's medication shipment from the [facility] and [facility] Delivery Pharmacy  [phone]).  All relevant notes have been reviewed.     Specialty medication(s) and dose(s) confirmed: Regimen is correct and unchanged.   Changes to medications: Shawn Huerta reports no changes at this time.  Changes to insurance: No  New side effects reported not previously addressed with a pharmacist or physician: None reported  Questions for the pharmacist: No    Confirmed patient received a Conservation officer, historic buildings and a Surveyor, mining with first shipment. The patient will receive a drug information handout for each medication shipped and additional FDA Medication Guides as required.       DISEASE/MEDICATION-SPECIFIC INFORMATION        N/A    SPECIALTY MEDICATION ADHERENCE     Medication Adherence    Patient reported X missed doses in the last month: 0  Specialty Medication: prograf 1mg   Patient is on additional specialty medications: Yes  Additional Specialty Medications: Myfortic 180mg   Patient Reported Additional Medication X Missed Doses in the Last Month: 0  Patient is on more than two specialty medications: No              Were doses missed due to medication being on hold? No    Myfortic 180 mg: 4 days of medicine on hand   Prograf 1 mg: 4 days of medicine on hand       REFERRAL TO PHARMACIST     Referral to the pharmacist: Not needed      [facility]     Shipping address confirmed in Epic.       Delivery Scheduled: Yes, Expected medication delivery date: 09/07/23.     Medication will be delivered via UPS to the prescription address in Epic WAM.    Shawn Huerta   [facility] and Home Delivery Pharmacy  Specialty Technician

## 2023-09-06 MED FILL — PROGRAF 1 MG CAPSULE: ORAL | 30 days supply | Qty: 540 | Fill #4

## 2023-09-06 MED FILL — MYFORTIC 180 MG TABLET,DELAYED RELEASE: ORAL | 30 days supply | Qty: 60 | Fill #4

## 2023-09-30 LAB — CBC W/ DIFFERENTIAL
BANDED NEUTROPHILS ABSOLUTE COUNT: 0 10*3/uL (ref 0.0–0.1)
BASOPHILS ABSOLUTE COUNT: 0.1 10*3/uL (ref 0.0–0.2)
BASOPHILS RELATIVE PERCENT: 1 %
EOSINOPHILS ABSOLUTE COUNT: 0.2 10*3/uL (ref 0.0–0.4)
EOSINOPHILS RELATIVE PERCENT: 3 %
HEMATOCRIT: 42.9 % (ref 37.5–51.0)
HEMOGLOBIN: 14.3 g/dL (ref 13.0–17.7)
IMMATURE GRANULOCYTES: 0 %
LYMPHOCYTES ABSOLUTE COUNT: 1.9 10*3/uL (ref 0.7–3.1)
LYMPHOCYTES RELATIVE PERCENT: 43 %
MEAN CORPUSCULAR HEMOGLOBIN CONC: 33.3 g/dL (ref 31.5–35.7)
MEAN CORPUSCULAR HEMOGLOBIN: 28.4 pg (ref 26.6–33.0)
MEAN CORPUSCULAR VOLUME: 85 fL (ref 79–97)
MONOCYTES ABSOLUTE COUNT: 0.5 10*3/uL (ref 0.1–0.9)
MONOCYTES RELATIVE PERCENT: 12 %
NEUTROPHILS ABSOLUTE COUNT: 1.9 10*3/uL (ref 1.4–7.0)
NEUTROPHILS RELATIVE PERCENT: 41 %
PLATELET COUNT: 249 10*3/uL (ref 150–450)
RED BLOOD CELL COUNT: 5.03 x10E6/uL (ref 4.14–5.80)
RED CELL DISTRIBUTION WIDTH: 13.2 % (ref 11.6–15.4)
WHITE BLOOD CELL COUNT: 4.5 10*3/uL (ref 3.4–10.8)

## 2023-09-30 LAB — COMPREHENSIVE METABOLIC PANEL
ALBUMIN: 4.3 g/dL (ref 3.8–4.9)
ALKALINE PHOSPHATASE: 73 IU/L (ref 44–121)
ALT (SGPT): 29 IU/L (ref 0–44)
AST (SGOT): 36 IU/L (ref 0–40)
BILIRUBIN TOTAL (MG/DL) IN SER/PLAS: 0.5 mg/dL (ref 0.0–1.2)
BLOOD UREA NITROGEN: 12 mg/dL (ref 6–24)
BUN / CREAT RATIO: 12 (ref 9–20)
CALCIUM: 9.4 mg/dL (ref 8.7–10.2)
CHLORIDE: 103 mmol/L (ref 96–106)
CO2: 24 mmol/L (ref 20–29)
CREATININE: 1.04 mg/dL (ref 0.76–1.27)
GLOBULIN, TOTAL: 3.2 g/dL (ref 1.5–4.5)
GLUCOSE: 140 mg/dL — ABNORMAL HIGH (ref 70–99)
POTASSIUM: 4.8 mmol/L (ref 3.5–5.2)
SODIUM: 139 mmol/L (ref 134–144)
TOTAL PROTEIN: 7.5 g/dL (ref 6.0–8.5)

## 2023-09-30 LAB — BILIRUBIN, DIRECT: BILIRUBIN DIRECT: 0.17 mg/dL (ref 0.00–0.40)

## 2023-09-30 LAB — MAGNESIUM: MAGNESIUM: 1.7 mg/dL (ref 1.6–2.3)

## 2023-09-30 LAB — GAMMA GT: GAMMA GLUTAMYL TRANSFERASE: 107 IU/L — ABNORMAL HIGH (ref 0–65)

## 2023-09-30 LAB — PHOSPHORUS: PHOSPHORUS, SERUM: 2.9 mg/dL (ref 2.8–4.1)

## 2023-10-02 DIAGNOSIS — Z79899 Other long term (current) drug therapy: Principal | ICD-10-CM

## 2023-10-02 DIAGNOSIS — Z944 Liver transplant status: Principal | ICD-10-CM

## 2023-10-02 LAB — TACROLIMUS LEVEL: TACROLIMUS BLOOD: 4.4 ng/mL (ref 2.0–20.0)

## 2023-10-04 NOTE — Unmapped (Signed)
The [facility] has made a second and final attempt to reach this patient to refill the following medication:mycophenolate: MYFORTIC 180 mg EC tablet and tacrolimus: PROGRAF 1 mg capsule.      We have left voicemails on the following phone numbers: [phone] and have sent a text message to the following phone numbers: [phone] .    Dates contacted: 09/29/23-10/04/23  Last scheduled delivery: 09/06/23    The patient may be at risk of non-compliance with this medication. The patient should call the [facility] at [phone]  Option 4, then Option 4: Infectious Disease, Transplant to refill medication.    Craige Cotta   [facility]

## 2023-10-16 NOTE — Unmapped (Signed)
[facility] Specialty and Home Delivery Pharmacy Refill Coordination Note    Specialty Medication(s) to be Shipped:   Transplant: Myfortic 180 mg and Prograf 1mg     Other medication(s) to be shipped: No additional medications requested for fill at this time     Brylin Hanauer, DOB: [birth year]  Phone: [phone] (home)       All above HIPAA information was verified with patient.     Was a Nurse, learning disability used for this call? No    Completed refill call assessment today to schedule patient's medication shipment from the [facility] and [facility] Delivery Pharm[facility]  [phone]).  All relevant notes have been reviewed.     Specialty medication(s) and dose(s) confirmed: Regimen is correct and unchanged.   Changes to medications: Dontrell reports no changes at this time.  Changes to insurance: No  New side effects reported not previously addressed with a pharmacist or physician: None reported  Questions for the pharmacist: No    Confirmed patient received a Conservation officer, historic buildings and a Surveyor, mining with first shipment. The patient will receive a drug information handout for each medication shipped and additional FDA Medication Guides as required.       DISEASE/MEDICATION-SPECIFIC INFORMATION        N/A    SPECIALTY MEDICATION ADHERENCE     Medication Adherence    Patient reported X missed doses in the last month: 0  Specialty Medication: MYFORTIC 180 mg EC tablet (mycophenolate)  Patient is on additional specialty medications: Yes  Additional Specialty Medications: PROGRAF 1 mg capsule (tacrolimus)  Patient Reported Additional Medication X Missed Doses in the Last Month: 0  Patient is on more than two specialty medications: No              Were doses missed due to medication being on hold? No    Myfortic  180 mg: 2 days of medicine on hand   Prograf 1 mg: 2 days of medicine on hand        REFERRAL TO PHARMACIST     Referral to the pharmacist: Not needed      [facility]     Shipping address confirmed in Epic.       Delivery Scheduled: Yes, Expected medication delivery date: 10/18/23.     Medication will be delivered via UPS to the prescription address in Epic WAM.    Willette Pa   [facility] Specialty and Home Delivery Pharmacy  Specialty Technician

## 2023-10-17 MED FILL — MYFORTIC 180 MG TABLET,DELAYED RELEASE: ORAL | 30 days supply | Qty: 60 | Fill #5

## 2023-10-17 MED FILL — PROGRAF 1 MG CAPSULE: ORAL | 30 days supply | Qty: 540 | Fill #5

## 2023-10-30 DIAGNOSIS — Z944 Liver transplant status: Principal | ICD-10-CM

## 2023-10-30 DIAGNOSIS — Z79899 Other long term (current) drug therapy: Principal | ICD-10-CM

## 2023-10-31 LAB — CBC W/ DIFFERENTIAL
BANDED NEUTROPHILS ABSOLUTE COUNT: 0 10*3/uL (ref 0.0–0.1)
BASOPHILS ABSOLUTE COUNT: 0.1 10*3/uL (ref 0.0–0.2)
BASOPHILS RELATIVE PERCENT: 1 %
EOSINOPHILS ABSOLUTE COUNT: 0.1 10*3/uL (ref 0.0–0.4)
EOSINOPHILS RELATIVE PERCENT: 3 %
HEMATOCRIT: 41.7 % (ref 37.5–51.0)
HEMOGLOBIN: 13.8 g/dL (ref 13.0–17.7)
IMMATURE GRANULOCYTES: 0 %
LYMPHOCYTES ABSOLUTE COUNT: 1.5 10*3/uL (ref 0.7–3.1)
LYMPHOCYTES RELATIVE PERCENT: 37 %
MEAN CORPUSCULAR HEMOGLOBIN CONC: 33.1 g/dL (ref 31.5–35.7)
MEAN CORPUSCULAR HEMOGLOBIN: 27.9 pg (ref 26.6–33.0)
MEAN CORPUSCULAR VOLUME: 84 fL (ref 79–97)
MONOCYTES ABSOLUTE COUNT: 0.5 10*3/uL (ref 0.1–0.9)
MONOCYTES RELATIVE PERCENT: 12 %
NEUTROPHILS ABSOLUTE COUNT: 2 10*3/uL (ref 1.4–7.0)
NEUTROPHILS RELATIVE PERCENT: 47 %
PLATELET COUNT: 220 10*3/uL (ref 150–450)
RED BLOOD CELL COUNT: 4.94 x10E6/uL (ref 4.14–5.80)
RED CELL DISTRIBUTION WIDTH: 13 % (ref 11.6–15.4)
WHITE BLOOD CELL COUNT: 4.2 10*3/uL (ref 3.4–10.8)

## 2023-10-31 LAB — COMPREHENSIVE METABOLIC PANEL
ALBUMIN: 4.3 g/dL (ref 3.8–4.9)
ALKALINE PHOSPHATASE: 66 IU/L (ref 44–121)
ALT (SGPT): 23 IU/L (ref 0–44)
AST (SGOT): 31 IU/L (ref 0–40)
BILIRUBIN TOTAL (MG/DL) IN SER/PLAS: 0.8 mg/dL (ref 0.0–1.2)
BLOOD UREA NITROGEN: 15 mg/dL (ref 6–24)
BUN / CREAT RATIO: 14 (ref 9–20)
CALCIUM: 9.2 mg/dL (ref 8.7–10.2)
CHLORIDE: 104 mmol/L (ref 96–106)
CO2: 24 mmol/L (ref 20–29)
CREATININE: 1.05 mg/dL (ref 0.76–1.27)
GLOBULIN, TOTAL: 3 g/dL (ref 1.5–4.5)
GLUCOSE: 153 mg/dL — ABNORMAL HIGH (ref 70–99)
POTASSIUM: 4 mmol/L (ref 3.5–5.2)
SODIUM: 143 mmol/L (ref 134–144)
TOTAL PROTEIN: 7.3 g/dL (ref 6.0–8.5)

## 2023-10-31 LAB — PHOSPHORUS: PHOSPHORUS, SERUM: 2.6 mg/dL — ABNORMAL LOW (ref 2.8–4.1)

## 2023-10-31 LAB — GAMMA GT: GAMMA GLUTAMYL TRANSFERASE: 97 IU/L — ABNORMAL HIGH (ref 0–65)

## 2023-10-31 LAB — BILIRUBIN, DIRECT: BILIRUBIN DIRECT: 0.27 mg/dL (ref 0.00–0.40)

## 2023-10-31 LAB — MAGNESIUM: MAGNESIUM: 1.7 mg/dL (ref 1.6–2.3)

## 2023-11-01 LAB — TACROLIMUS LEVEL: TACROLIMUS BLOOD: 6.2 ng/mL (ref 2.0–20.0)

## 2023-11-02 NOTE — Unmapped (Addendum)
Lab results from 10/30/23 were reviewed by PA Edyth Gunnels and ANP Vallery Sa, and with CPP Cecilie Lowers. Liver enzymes are stable. Tacrolimus is slightly elevated at 6.2 (goal 3-5). Per CPP Cecilie Lowers no changes recommended at this time. Pt to repeat labs in 3 months.    Messaged Pt in MyChart. Pt messaged understanding.

## 2023-11-21 NOTE — Unmapped (Signed)
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     Was a Nurse, learning disability used for this call? No    Completed refill call assessment today to schedule patient's medication shipment from the [facility] and Home Delivery Pharmacy  [phone]).  All relevant notes have been reviewed.     Specialty medication(s) and dose(s) confirmed: Regimen is correct and unchanged.   Changes to medications: Shawn Huerta reports no changes at this time.  Changes to insurance: No  New side effects reported not previously addressed with a pharmacist or physician: None reported  Questions for the pharmacist: No    Confirmed patient received a Conservation officer, historic buildings and a Surveyor, mining with first shipment. The patient will receive a drug information handout for each medication shipped and additional FDA Medication Guides as required.       DISEASE/MEDICATION-SPECIFIC INFORMATION        N/A    SPECIALTY MEDICATION ADHERENCE              Were doses missed due to medication being on hold? No    Myfortic 180 mg: 3 days of medicine on hand   Prograf 1 mg: 3 days of medicine on hand       REFERRAL TO PHARMACIST     Referral to the pharmacist: Not needed      [name]     Shipping address confirmed in Epic.       Delivery Scheduled: Yes, Expected medication delivery date: 1/16.     Medication will be delivered via UPS to the prescription address in Epic WAM.    Shawn Huerta, PharmD   [name] Specialty and Home Delivery Pharmacy  Specialty Pharmacist

## 2023-11-22 MED FILL — MYFORTIC 180 MG TABLET,DELAYED RELEASE: ORAL | 30 days supply | Qty: 60 | Fill #6

## 2023-11-22 MED FILL — PROGRAF 1 MG CAPSULE: ORAL | 30 days supply | Qty: 540 | Fill #6

## 2023-11-27 DIAGNOSIS — Z79899 Other long term (current) drug therapy: Principal | ICD-10-CM

## 2023-11-27 DIAGNOSIS — Z944 Liver transplant status: Principal | ICD-10-CM

## 2023-12-15 NOTE — Unmapped (Signed)
Call placed to patient to discuss scheduling annual visit in May - left vm with appt date/time and confirmed he will receive MyChart notification.  Requested return call if need to reschedule.  Relayed he will be seen by NP Metheny.

## 2023-12-25 DIAGNOSIS — Z79899 Other long term (current) drug therapy: Principal | ICD-10-CM

## 2023-12-25 DIAGNOSIS — Z944 Liver transplant status: Principal | ICD-10-CM

## 2023-12-25 NOTE — Unmapped (Signed)
 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     Was a Nurse, learning disability used for this call? No    Completed refill call assessment today to schedule patient's medication shipment from the [facility] and Home Delivery Pharmacy  [phone]).  All relevant notes have been reviewed.     Specialty medication(s) and dose(s) confirmed: Regimen is correct and unchanged.   Changes to medications: [name] reports no changes at this time.  Changes to insurance: No  New side effects reported not previously addressed with a pharmacist or physician: None reported  Questions for the pharmacist: No    Confirmed patient received a Conservation officer, historic buildings and a Surveyor, mining with first shipment. The patient will receive a drug information handout for each medication shipped and additional FDA Medication Guides as required.       DISEASE/MEDICATION-SPECIFIC INFORMATION        N/A    SPECIALTY MEDICATION ADHERENCE     Medication Adherence    Patient reported X missed doses in the last month: 0  Specialty Medication: PROGRAF 1 mg capsule (tacrolimus)  Patient is on additional specialty medications: Yes  Additional Specialty Medications: MYFORTIC 180 mg EC tablet (mycophenolate)  Patient Reported Additional Medication X Missed Doses in the Last Month: 0  Patient is on more than two specialty medications: No  Any gaps in refill history greater than 2 weeks in the last 3 months: no  Demonstrates understanding of importance of adherence: yes  Informant: patient  Confirmed plan for next specialty medication refill: delivery by pharmacy  Refills needed for supportive medications: not needed          Refill Coordination    Has the Patients' Contact Information Changed: No  Is the Shipping Address Different: No         Were doses missed due to medication being on hold? No    MYFORTIC 180  mg: 4 days of medicine on hand   PROGRAF 1  mg: 2 days of medicine on hand       REFERRAL TO PHARMACIST     Referral to the pharmacist: Not needed      [facility]     Shipping address confirmed in Epic.       Delivery Scheduled: Yes, Expected medication delivery date: 12/28/23.     Medication will be delivered via UPS to the prescription address in Epic WAM.    Shawn Huerta   [facility] Specialty and Home Delivery Pharmacy  Specialty Technician

## 2023-12-27 MED FILL — PROGRAF 1 MG CAPSULE: ORAL | 30 days supply | Qty: 540 | Fill #7

## 2023-12-27 MED FILL — MYFORTIC 180 MG TABLET,DELAYED RELEASE: ORAL | 30 days supply | Qty: 60 | Fill #7

## 2023-12-29 DIAGNOSIS — Z79899 Other long term (current) drug therapy: Principal | ICD-10-CM

## 2023-12-29 DIAGNOSIS — Z944 Liver transplant status: Principal | ICD-10-CM

## 2023-12-29 MED ORDER — TACROLIMUS 1 MG CAPSULE, IMMEDIATE-RELEASE
ORAL_CAPSULE | Freq: Two times a day (BID) | ORAL | 0 refills | 7.00 days | Status: CP
Start: 2023-12-29 — End: ?

## 2023-12-29 NOTE — Unmapped (Signed)
 Patient called to request short supply of tacrolimus be scripted to local pharmacy.  Due to inclement weather [facility] notified him his shipment will be delayed of prograf (he will run out tomorrow).  Scripted supply to [facility] for generic tacrolimus.

## 2024-01-22 DIAGNOSIS — Z79899 Other long term (current) drug therapy: Principal | ICD-10-CM

## 2024-01-22 DIAGNOSIS — Z944 Liver transplant status: Principal | ICD-10-CM

## 2024-01-29 DIAGNOSIS — Z944 Liver transplant status: Principal | ICD-10-CM

## 2024-01-29 DIAGNOSIS — Z79899 Other long term (current) drug therapy: Principal | ICD-10-CM

## 2024-01-29 MED ORDER — PROGRAF 1 MG CAPSULE
ORAL_CAPSULE | Freq: Two times a day (BID) | ORAL | 3 refills | 90 days
Start: 2024-01-29 — End: ?

## 2024-01-29 NOTE — Unmapped (Signed)
 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     Was a Nurse, learning disability used for this call? No    Completed refill call assessment today to schedule patient's medication shipment from the [facility] and Home Delivery Pharmacy  [phone]).  All relevant notes have been reviewed.     Specialty medication(s) and dose(s) confirmed: Regimen is correct and unchanged.   Changes to medications: Audie reports no changes at this time.  Changes to insurance: No  New side effects reported not previously addressed with a pharmacist or physician: None reported  Questions for the pharmacist: No    Confirmed patient received a Conservation officer, historic buildings and a Surveyor, mining with first shipment. The patient will receive a drug information handout for each medication shipped and additional FDA Medication Guides as required.       DISEASE/MEDICATION-SPECIFIC INFORMATION        N/A    SPECIALTY MEDICATION ADHERENCE     Medication Adherence    Patient reported X missed doses in the last month: 0  Specialty Medication: mycophenolate: MYFORTIC 180 mg EC tablet  Patient is on additional specialty medications: Yes  Additional Specialty Medications: tacrolimus: PROGRAF 1 mg capsule  Patient Reported Additional Medication X Missed Doses in the Last Month: 0  Patient is on more than two specialty medications: No              Were doses missed due to medication being on hold? No     mycophenolate: MYFORTIC 180 mg EC tablet: 10-14 days of medicine on hand    tacrolimus: PROGRAF 1 mg capsule: 10-14 days of medicine on hand       REFERRAL TO PHARMACIST     Referral to the pharmacist: Not needed      [facility] Shipping address confirmed in Epic.     Cost and Payment: Patient has a $0 copay, payment information is not required.    Delivery Scheduled: Yes, Expected medication delivery date: 02/08/24.  However, Rx request for refills was sent to the provider as there are none remaining.     Medication will be delivered via UPS to the prescription address in Epic WAM.    Craige Cotta   [facility] Specialty and Home Delivery Pharmacy  Specialty Technician

## 2024-01-31 DIAGNOSIS — Z79899 Other long term (current) drug therapy: Principal | ICD-10-CM

## 2024-01-31 DIAGNOSIS — Z944 Liver transplant status: Principal | ICD-10-CM

## 2024-01-31 MED ORDER — PROGRAF 1 MG CAPSULE
ORAL_CAPSULE | Freq: Two times a day (BID) | ORAL | 3 refills | 90 days
Start: 2024-01-31 — End: ?

## 2024-01-31 MED ORDER — TACROLIMUS 1 MG CAPSULE, IMMEDIATE-RELEASE
ORAL_CAPSULE | Freq: Two times a day (BID) | ORAL | 3 refills | 90 days | Status: CP
Start: 2024-01-31 — End: ?
  Filled 2024-02-07: qty 540, 30d supply, fill #0

## 2024-02-07 MED FILL — MYFORTIC 180 MG TABLET,DELAYED RELEASE: ORAL | 30 days supply | Qty: 60 | Fill #8

## 2024-02-20 NOTE — Unmapped (Signed)
 [facility] and Home Delivery Pharmacy Clinical Assessment & Refill Coordination Note    Shawn Huerta, DOB: November 25, 1965  Phone: [phone] (home)     All above HIPAA information was verified with patient.     Was a Nurse, learning disability used for this call? No    Specialty Medication(s):   Transplant: Myfortic  180mg  and Prograf  1mg      Current Outpatient Medications   Medication Sig Dispense Refill    allopurinol (ZYLOPRIM) 100 MG tablet Take 1 tablet (100 mg total) by mouth daily.      amLODIPine (NORVASC) 10 MG tablet Take 1 tablet (10 mg total) by mouth daily. for blood pressure      aspirin (ECOTRIN) 81 MG tablet Take 1 tablet (81 mg total) by mouth daily.      glimepiride (AMARYL) 1 MG tablet TAKE 1 TABLET BY MOUTH ONCE DAILY WITH BREAKFAST OR THE FIRST MAIN MEAL OF THE DAY FOR DIABETES      hydrALAZINE (APRESOLINE) 25 MG tablet TAKE 1 TABLET BY MOUTH TWICE DAILY FOR BLOOD PRESSURE      metFORMIN (GLUCOPHAGE) 500 MG tablet Take 2 tablets (1,000 mg total) by mouth in the morning.      metoprolol succinate (TOPROL-XL) 50 MG 24 hr tablet Take 1 tablet (50 mg total) by mouth daily.      multivitamin capsule Take 1 capsule by mouth daily.      MYFORTIC  180 mg EC tablet Take 1 tablet (180 mg total) by mouth two (2) times a day. 180 tablet 3    tacrolimus  (PROGRAF ) 1 MG capsule Take 9 capsules (9 mg total) by mouth two (2) times a day. 1620 capsule 3     No current facility-administered medications for this visit.        Changes to medications: Jordell reports no changes at this time.    Medication list has been reviewed and updated in Epic: Yes    Allergies   Allergen Reactions    Lisinopril Swelling    Doxazosin      Other reaction(s): ineffective for BP (02/2021)    Hydrochlorothiazide      Other reaction(s): ineffective for BP (02/2021)    Spironolactone      Other reaction(s): ED (08/2017)    Valsartan      Other reaction(s): angioedema (06/2017)       Changes to allergies: No    Allergies have been reviewed and updated in Epic: Yes    SPECIALTY MEDICATION ADHERENCE     Myfortic  180mg   : 10 days of medicine on hand   Prograf  1mg   : 10 days of medicine on hand       Medication Adherence    Patient reported X missed doses in the last month: 0  Specialty Medication: myfortic  180mg   Patient is on additional specialty medications: Yes  Additional Specialty Medications: Prograf  1mg   Patient Reported Additional Medication X Missed Doses in the Last Month: 0  Patient is on more than two specialty medications: No          Specialty medication(s) dose(s) confirmed: Regimen is correct and unchanged.     Are there any concerns with adherence? No    Adherence counseling provided? Not needed    CLINICAL MANAGEMENT AND INTERVENTION      Clinical Benefit Assessment:    Do you feel the medicine is effective or helping your condition? Yes    Clinical Benefit counseling provided? Not needed    Adverse Effects Assessment:    Are  you experiencing any side effects? No    Are you experiencing difficulty administering your medicine? No    Quality of Life Assessment:    Quality of Life    Rheumatology  Oncology  Dermatology  Cystic Fibrosis          How many days over the past month did your transplant  keep you from your normal activities? For example, brushing your teeth or getting up in the morning. 0    Have you discussed this with your provider? Not needed    Acute Infection Status:    Acute infections noted within Epic:  No active infections    Patient reported infection: None    Therapy Appropriateness:    Is therapy appropriate based on current medication list, adverse reactions, adherence, clinical benefit and progress toward achieving therapeutic goals? Yes, therapy is appropriate and should be continued     Clinical Intervention:    Was an intervention completed as part of this clinical assessment? No    DISEASE/MEDICATION-SPECIFIC INFORMATION      N/A    Solid Organ Transplant: Not Applicable    PATIENT SPECIFIC NEEDS     Does the patient have any physical, cognitive, or cultural barriers? No    Is the patient high risk? Yes, patient is taking a REMS drug. Medication is dispensed in compliance with REMS program    Does the patient require physician intervention or other additional services (i.e., nutrition, smoking cessation, social work)? No    Does the patient have an additional or emergency contact listed in their chart? Yes    SOCIAL DETERMINANTS OF HEALTH     At the [facility], we have learned that life circumstances - like trouble affording food, housing, utilities, or transportation can affect the health of many of our patients.   That is why we wanted to ask: are you currently experiencing any life circumstances that are negatively impacting your health and/or quality of life? Patient declined to answer    Social Drivers of Health     Food Insecurity: Not on file   Tobacco Use: Low Risk  (03/21/2023)    Patient History     Smoking Tobacco Use: Never     Smokeless Tobacco Use: Never     Passive Exposure: Past   Transportation Needs: Not on file   Alcohol Use: Not on file   Housing: Not on file   Physical Activity: Not on file   Utilities: Not on file   Stress: Not on file   Interpersonal Safety: Not on file   Substance Use: Not on file (09/17/2023)   Intimate Partner Violence: Not on file   Social Connections: Not on file   Financial Resource Strain: Not on file   Depression: Not on file   Internet Connectivity: Not on file   Health Literacy: Not on file       Would you be willing to receive help with any of the needs that you have identified today? Not applicable       SHIPPING     Specialty Medication(s) to be Shipped:   Transplant: Myfortic  180mg  and Prograf  1mg     Other medication(s) to be shipped: No additional medications requested for fill at this time     Changes to insurance: No    Cost and Payment: Patient has a copay of $unable to determine on scheduling date as refill too soon, however notes added that patient says should still both be $0 as in past. They  are aware and have authorized the pharmacy to charge the credit card on file.    Delivery Scheduled: Yes, Expected medication delivery date: 02/28/2024.     Medication will be delivered via UPS to the confirmed prescription address in [facility].    The patient will receive a drug information handout for each medication shipped and additional FDA Medication Guides as required.  Verified that patient has previously received a Conservation officer, historic buildings and a Surveyor, mining.    The patient or caregiver noted above participated in the development of this care plan and knows that they can request review of or adjustments to the care plan at any time.      All of the patient's questions and concerns have been addressed.    Christine Cozier, PharmD   [facility] Delivery Pharm[facility] Specialty Pharmacist

## 2024-02-27 MED FILL — MYFORTIC 180 MG TABLET,DELAYED RELEASE: ORAL | 30 days supply | Qty: 60 | Fill #9

## 2024-02-27 MED FILL — PROGRAF 1 MG CAPSULE: ORAL | 30 days supply | Qty: 540 | Fill #1

## 2024-03-11 DIAGNOSIS — Z944 Liver transplant status: Principal | ICD-10-CM

## 2024-03-11 DIAGNOSIS — Z79899 Other long term (current) drug therapy: Principal | ICD-10-CM

## 2024-03-13 ENCOUNTER — Ambulatory Visit: Admit: 2024-03-13 | Discharge: 2024-03-13 | Payer: PRIVATE HEALTH INSURANCE

## 2024-03-13 DIAGNOSIS — Z79899 Other long term (current) drug therapy: Principal | ICD-10-CM

## 2024-03-13 DIAGNOSIS — Z944 Liver transplant status: Principal | ICD-10-CM

## 2024-03-13 DIAGNOSIS — Z23 Encounter for immunization: Principal | ICD-10-CM

## 2024-03-13 DIAGNOSIS — Z2911 Encounter for prophylactic immunotherapy for respiratory syncytial virus (RSV): Principal | ICD-10-CM

## 2024-03-13 LAB — CBC W/ AUTO DIFF
BASOPHILS ABSOLUTE COUNT: 0.1 10*9/L (ref 0.0–0.1)
BASOPHILS RELATIVE PERCENT: 1.2 %
EOSINOPHILS ABSOLUTE COUNT: 0.1 10*9/L (ref 0.0–0.5)
EOSINOPHILS RELATIVE PERCENT: 1.7 %
HEMATOCRIT: 41.2 % (ref 39.0–48.0)
HEMOGLOBIN: 14.1 g/dL (ref 12.9–16.5)
LYMPHOCYTES ABSOLUTE COUNT: 2.2 10*9/L (ref 1.1–3.6)
LYMPHOCYTES RELATIVE PERCENT: 45 %
MEAN CORPUSCULAR HEMOGLOBIN CONC: 34.3 g/dL (ref 32.0–36.0)
MEAN CORPUSCULAR HEMOGLOBIN: 28.4 pg (ref 25.9–32.4)
MEAN CORPUSCULAR VOLUME: 82.9 fL (ref 77.6–95.7)
MEAN PLATELET VOLUME: 8.3 fL (ref 6.8–10.7)
MONOCYTES ABSOLUTE COUNT: 0.5 10*9/L (ref 0.3–0.8)
MONOCYTES RELATIVE PERCENT: 9.1 %
NEUTROPHILS ABSOLUTE COUNT: 2.1 10*9/L (ref 1.8–7.8)
NEUTROPHILS RELATIVE PERCENT: 43 %
PLATELET COUNT: 221 10*9/L (ref 150–450)
RED BLOOD CELL COUNT: 4.97 10*12/L (ref 4.26–5.60)
RED CELL DISTRIBUTION WIDTH: 13.3 % (ref 12.2–15.2)
WBC ADJUSTED: 5 10*9/L (ref 3.6–11.2)

## 2024-03-13 LAB — TACROLIMUS LEVEL: TACROLIMUS BLOOD: 6.2 ng/mL

## 2024-03-13 LAB — COMPREHENSIVE METABOLIC PANEL
ALBUMIN: 3.9 g/dL (ref 3.4–5.0)
ALKALINE PHOSPHATASE: 63 U/L (ref 46–116)
ALT (SGPT): 28 U/L (ref 10–49)
ANION GAP: 10 mmol/L (ref 5–14)
AST (SGOT): 36 U/L — ABNORMAL HIGH (ref <=34)
BILIRUBIN TOTAL: 0.5 mg/dL (ref 0.3–1.2)
BLOOD UREA NITROGEN: 12 mg/dL (ref 9–23)
BUN / CREAT RATIO: 12
CALCIUM: 9.7 mg/dL (ref 8.7–10.4)
CHLORIDE: 103 mmol/L (ref 98–107)
CO2: 28 mmol/L (ref 20.0–31.0)
CREATININE: 0.97 mg/dL (ref 0.73–1.18)
EGFR CKD-EPI (2021) MALE: >90 mL/min/1.73m2 (ref >=60)
GLUCOSE RANDOM: 144 mg/dL (ref 70–179)
POTASSIUM: 4.5 mmol/L (ref 3.4–4.8)
PROTEIN TOTAL: 8.2 g/dL (ref 5.7–8.2)
SODIUM: 141 mmol/L (ref 135–145)

## 2024-03-13 LAB — MAGNESIUM: MAGNESIUM: 1.4 mg/dL — ABNORMAL LOW (ref 1.6–2.6)

## 2024-03-13 LAB — GAMMA GT: GAMMA GLUTAMYL TRANSFERASE: 115 U/L — ABNORMAL HIGH (ref 0–73)

## 2024-03-13 LAB — BILIRUBIN, DIRECT: BILIRUBIN DIRECT: 0.1 mg/dL (ref 0.00–0.30)

## 2024-03-13 LAB — PHOSPHORUS: PHOSPHORUS: 2.7 mg/dL (ref 2.4–5.1)

## 2024-03-13 MED ORDER — TACROLIMUS 1 MG CAPSULE, IMMEDIATE-RELEASE
ORAL_CAPSULE | Freq: Two times a day (BID) | ORAL | 3 refills | 90.00000 days | Status: CP
Start: 2024-03-13 — End: ?
  Filled 2024-04-08: qty 480, 30d supply, fill #0

## 2024-03-13 MED ORDER — MYFORTIC 180 MG TABLET,DELAYED RELEASE
ORAL_TABLET | Freq: Two times a day (BID) | ORAL | 3 refills | 90.00000 days | Status: CP
Start: 2024-03-13 — End: ?
  Filled 2024-04-08: qty 60, 30d supply, fill #0

## 2024-03-13 NOTE — Unmapped (Signed)
 Patient seen for annual post liver transplant visit by NP Metheny (previously followed by NP Harms).  Overall her reports doing well - continues to work full time with [employer].  Denies N/V/D/F and denies alcohol, tobacco or drug use.  Follows with PCP Sparks locally for primary care who manages his blood pressure and blood glucose.  BP mildly elevated today prior to meds - encouraged him to follow-up with PCP if it remains elevated.      Obtains routine labs @ local [facility] and fills his myfortic and tacrolimus via [facility] pharmacy (tac goal 3-5).    Has not seen Dr. Sheria Dills for IBD/UC history since 2023 he is due for pouchoscopy - NP Leonides Ramp will assist with securing follow-up visit in IBD clinic.    Patient obtained COVID and RSV vaccinations today.    Patient confirms he has upcoming dermatology appt for skin surveillance.    ~5 minutes spent on post transplant education topics.

## 2024-03-13 NOTE — Unmapped (Signed)
 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 He has not had any rejection or biliary complications after transplant.  He underwent colectomy + IPAA for his UC in 2012.     Liver replaced by transplant  Immunosuppression:   -Continue FK 9mg  BID with goal 3-5  -continue Myfortic 180 mg BID. Dose decreased at last years visit but patient did not get routine lab work. If he has routine lab work done and enzymes stable for 6 months can discontinue  -standing labs per protocol every 3 months  -Discussed need for yearly full body skin examination, as there is an increased risk of skin cancer with immunosuppression.   -Continue routine age-related cancer screening.     Ulcerative Colitis: s/p TAC with IPAA in 2012. No GI issues  -Continue to follow up with Dr Sheria Dills as directed    Metabolic syndrome:  -discussed importance of controlling diabetes, hypertension and hyperlipidemia. He is being followed closely by PCP       Vaccinations: We recommend that patients have vaccinations to prevent various infections that can occur, especially in the setting of immunosuppression. The following vaccinations should be given:  - Hepatitis A: HAV IgG+  -Hepatitis B:Heplisav series 04/2021, 03/29/2022, check immunity  -Influenza (yearly): due in fall 2024  -Pneumococcal: PPSV23 04/2017, PCV-13 02/2017. Prevnar 20 given 03/21/2023  -Zoster: Shingrix x 2 given in 2018-2019  -SARS-CoV-2:  Pfizer x 3, bivalent 03/2022, 03/13/2024  -RSV 03/13/2024    No follow-ups on file.    Laurelyn Ponder, NP  [facility]  Subjective   History of Present Illness   Accompanied by: N/A (unaccompanied)    58 y.o. 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 Hx elevated enzymes in 08/2020 after missing 5-10 doses in 30 days, short burst of steroids and resuming medicines enzymes normalized. No history of rejection or biliary issues    Interval history:   His last visit was on 03/21/2023. In the interim, he re-established care with IBD Gastroenterology, Dr Sheria Dills. He has not had any changes in his health.     Objective   Physical Exam   Vital Signs: BP 146/94  - Pulse 59  - Temp 36.1 ??C (96.9 ??F) (Tympanic)  - Ht 188 cm (6' 2.02)  - Wt (!) 110.4 kg (243 lb 4.8 oz)  - SpO2 98%  - BMI 31.22 kg/m??   Constitutional: He is in no apparent distress, with good coloring  HENT: conjunctiva clear, anicteric, nares without discharge, neck supple  CV: Regular rate and rhythm  Lung: respirations even and unlabored  Abdomen: soft, non-distended, non-tender. No palpable ascites.   Extremities: No edema, well perfused  Neuro: No focal deficits. No asterixis.   Mental Status: Thought organized, appropriate affect, engaged in conversation        Patient is taking immunosuppressive medications due to liver transplantation and requires monitoring of renal function for signs of toxicity  I personally spent 30 minutes face-to-face and non-face-to-face in the care of this patient, which includes all pre, intra, and post visit time on the date of service

## 2024-03-13 NOTE — Unmapped (Signed)
 Patient has elevated BP today. Patient reports that he has not taken his Bp medication this morning, Patient denies any headaches, vision changes or weakness today.    Per provider, the patient received Covid-19 Vac, (40yr+) (Comirnaty) Mrna Pfizer and RSV VACCINE, BIVALENT (PF) (ABRYSVO)  vaccine.  Patient ID verified with name and date of birth.  All screening questions were answered.  Vaccine(s) were administered as ordered.  See immunization history for documentation.  Patient tolerated the injection(s) well with no issues noted.  Vaccine Information sheet given to the patient.

## 2024-03-14 DIAGNOSIS — Z79899 Other long term (current) drug therapy: Principal | ICD-10-CM

## 2024-03-14 DIAGNOSIS — Z944 Liver transplant status: Principal | ICD-10-CM

## 2024-03-14 MED ORDER — TACROLIMUS 1 MG CAPSULE, IMMEDIATE-RELEASE
ORAL_CAPSULE | Freq: Two times a day (BID) | ORAL | 3 refills | 90.00000 days | Status: CP
Start: 2024-03-14 — End: ?

## 2024-03-14 NOTE — Unmapped (Addendum)
 03/14/24 - Reviewed patient's 5/7 tac level of 6.5 (goal 3-5) with NP Metheny who orders patient to reduce dosing from 9mg  BID to 8mg  BID - advised he repeat labs in 2 weeks to confirm stability of trough level - sent MyCHart message to relay this.      03/18/24 - No reply to MyChart message thus placed call to patient to relay this - he verbalized understanding/agreement and will repeat labs in 2 weeks.

## 2024-03-15 NOTE — Unmapped (Addendum)
 [facility] Pharmacist has reviewed a new prescription for prograf that indicates a dose decrease.  Patient was counseled on this dosage change by coordinator EL- see epic note from 5/8.  Next refill call date adjusted if necessary.        Clinical Assessment Needed For: Dose Change  Medication: Prograf 1mg  capsule  Last Fill Date/Day Supply: 02/27/2024 / 30 days  Refill Too Soon until 03/26/2024  Was previous dose already scheduled to fill: No    Notes to Pharmacist: Will re-test on 05/20

## 2024-03-26 NOTE — Unmapped (Signed)
Therapy Update Follow Up: No issues - Copay = $0 for 30ds

## 2024-04-05 NOTE — Unmapped (Signed)
 [facility] Special[facility] and Home Delivery Pharmacy Refill Coordination Note    Specialty Medication(s) to be Shipped:   Transplant: Myfortic 180mg  and Prograf 1mg     Other medication(s) to be shipped: No additional medications requested for fill at this time     Shawn Huerta, DOB: [birth year]  Phone: [phone] (home)       All above HIPAA information was verified with patient.     Was a Nurse, learning disability used for this call? No    Completed refill call assessment today to schedule patient's medication shipment from the [facility] and Home Delivery Pharmacy  [phone]).  All relevant notes have been reviewed.     Specialty medication(s) and dose(s) confirmed: Regimen is correct and unchanged.   Changes to medications: Tavyn reports no changes at this time.  Changes to insurance: No  New side effects reported not previously addressed with a pharmacist or physician: None reported  Questions for the pharmacist: No    Confirmed patient received a Conservation officer, historic buildings and a Surveyor, mining with first shipment. The patient will receive a drug information handout for each medication shipped and additional FDA Medication Guides as required.       DISEASE/MEDICATION-SPECIFIC INFORMATION        N/A    SPECIALTY MEDICATION ADHERENCE     Medication Adherence    Patient reported X missed doses in the last month: 0  Specialty Medication: mycophenolate: MYFORTIC 180 mg EC tablet  Patient is on additional specialty medications: Yes  Additional Specialty Medications: tacrolimus: PROGRAF 1 mg capsule  Patient Reported Additional Medication X Missed Doses in the Last Month: 0  Patient is on more than two specialty medications: No              Were doses missed due to medication being on hold? No      mycophenolate: MYFORTIC 180 mg EC tablet: 3 days of medicine on hand     tacrolimus: PROGRAF 1 mg capsule: 3 days of medicine on hand     REFERRAL TO PHARMACIST     Referral to the pharmacist: Not needed      SHIPPING     Shipping address confirmed in Epic.     Cost and Payment: Patient has a $0 copay, payment information is not required.    Delivery Scheduled: Yes, Expected medication delivery date: 04/09/2024.     Medication will be delivered via UPS to the prescription address in Epic WAM.    Mickel Alanis [facility] Special[facility] and Home Delivery Pharmacy  Specialty Technician

## 2024-04-08 DIAGNOSIS — Z79899 Other long term (current) drug therapy: Principal | ICD-10-CM

## 2024-04-08 DIAGNOSIS — Z944 Liver transplant status: Principal | ICD-10-CM

## 2024-05-06 DIAGNOSIS — Z944 Liver transplant status: Principal | ICD-10-CM

## 2024-05-06 DIAGNOSIS — Z79899 Other long term (current) drug therapy: Principal | ICD-10-CM

## 2024-05-06 NOTE — Unmapped (Signed)
 [facility] Special[facility] and Home Delivery Pharmacy Refill Coordination Note    Specialty Medication(s) to be Shipped:   Transplant: Myfortic  180mg  and Prograf  1mg     Other medication(s) to be shipped: No additional medications requested for fill at this time     Shawn Huerta, DOB: 14-Mar-1966  Phone: [phone] (home)       All above HIPAA information was verified with patient.     Was a Nurse, learning disability used for this call? No    Completed refill call assessment today to schedule patient's medication shipment from the [facility] and Home Delivery Pharmacy  [phone]).  All relevant notes have been reviewed.     Specialty medication(s) and dose(s) confirmed: Regimen is correct and unchanged.   Changes to medications: Shawn Huerta reports no changes at this time.  Changes to insurance: No  New side effects reported not previously addressed with a pharmacist or physician: None reported  Questions for the pharmacist: No    Confirmed patient received a Conservation officer, historic buildings and a Surveyor, mining with first shipment. The patient will receive a drug information handout for each medication shipped and additional FDA Medication Guides as required.       DISEASE/MEDICATION-SPECIFIC INFORMATION        N/A    SPECIALTY MEDICATION ADHERENCE     Medication Adherence    Patient reported X missed doses in the last month: 0  Specialty Medication: PROGRAF  1 mg capsule (tacrolimus )  Patient is on additional specialty medications: Yes  Additional Specialty Medications: MYFORTIC  180 mg EC tablet (mycophenolate )  Patient Reported Additional Medication X Missed Doses in the Last Month: 0  Patient is on more than two specialty medications: No              Were doses missed due to medication being on hold? No    PROGRAF  1 mg capsule (tacrolimus )  : 3 days of medicine on hand   MYFORTIC  180 mg EC tablet (mycophenolate )  : 3 days of medicine on hand       REFERRAL TO PHARMACIST     Referral to the pharmacist: Not needed      [facility] Shipping address confirmed in Epic.     Cost and Payment: Patient has a $0 copay, payment information is not required.    Delivery Scheduled: Yes, Expected medication delivery date: 05/08/24.     Medication will be delivered via UPS to the prescription address in Epic WAM.    Lonni Skeens   [facility] Special[facility] and Home Delivery Pharmacy  Specialty Technician

## 2024-05-07 MED FILL — MYFORTIC 180 MG TABLET,DELAYED RELEASE: ORAL | 30 days supply | Qty: 60 | Fill #1

## 2024-05-07 MED FILL — PROGRAF 1 MG CAPSULE: ORAL | 30 days supply | Qty: 480 | Fill #1

## 2024-06-03 DIAGNOSIS — Z79899 Other long term (current) drug therapy: Principal | ICD-10-CM

## 2024-06-03 DIAGNOSIS — Z944 Liver transplant status: Principal | ICD-10-CM

## 2024-06-03 NOTE — Unmapped (Signed)
 [facility] Specialty and Home Delivery Pharmacy Refill Coordination Note    Specialty Medication(s) to be Shipped:   Transplant: Myfortic  180mg  and Prograf  1mg     Other medication(s) to be shipped: No additional medications requested for fill at this time     Shawn Huerta, DOB: 06-Nov-1966  Phone: [phone] (home)       All above HIPAA information was verified with patient.     Was a Nurse, learning disability used for this call? No    Completed refill call assessment today to schedule patient's medication shipment from the [facility] and Home Delivery Pharmacy  [phone]).  All relevant notes have been reviewed.     Specialty medication(s) and dose(s) confirmed: Regimen is correct and unchanged.   Changes to medications: Shawn Huerta reports no changes at this time.  Changes to insurance: No  New side effects reported not previously addressed with a pharmacist or physician: None reported  Questions for the pharmacist: No    Confirmed patient received a Conservation officer, historic buildings and a Surveyor, mining with first shipment. The patient will receive a drug information handout for each medication shipped and additional FDA Medication Guides as required.       DISEASE/MEDICATION-SPECIFIC INFORMATION        N/A    SPECIALTY MEDICATION ADHERENCE     Medication Adherence    Patient reported X missed doses in the last month: 0  Specialty Medication: MYFORTIC  180 mg EC tablet (mycophenolate )  Patient is on additional specialty medications: Yes  Additional Specialty Medications: PROGRAF  1 mg capsule (tacrolimus )  Patient Reported Additional Medication X Missed Doses in the Last Month: 0  Patient is on more than two specialty medications: No              Were doses missed due to medication being on hold? No      MYFORTIC  180 mg EC tablet (mycophenolate ): 7 days of medicine on hand     PROGRAF  1 mg capsule (tacrolimus ): 7 days of medicine on hand       REFERRAL TO PHARMACIST     Referral to the pharmacist: Not needed      [facility] Shipping address confirmed in Epic.     Cost and Payment: Patient has a $0 copay, payment information is not required.    Delivery Scheduled: Yes, Expected medication delivery date: 06/06/2024.     Medication will be delivered via UPS to the prescription address in Epic WAM.    Tom [facility] Specialty and Home Delivery Pharmacy  Specialty Technician

## 2024-06-05 MED FILL — MYFORTIC 180 MG TABLET,DELAYED RELEASE: ORAL | 30 days supply | Qty: 60 | Fill #2

## 2024-06-05 MED FILL — PROGRAF 1 MG CAPSULE: ORAL | 30 days supply | Qty: 480 | Fill #2

## 2024-06-11 LAB — COMPREHENSIVE METABOLIC PANEL
ALBUMIN: 4.3 g/dL (ref 3.8–4.9)
ALKALINE PHOSPHATASE: 63 IU/L (ref 44–121)
ALT (SGPT): 25 IU/L (ref 0–44)
AST (SGOT): 34 IU/L (ref 0–40)
BILIRUBIN TOTAL (MG/DL) IN SER/PLAS: 0.7 mg/dL (ref 0.0–1.2)
BLOOD UREA NITROGEN: 14 mg/dL (ref 6–24)
BUN / CREAT RATIO: 13 (ref 9–20)
CALCIUM: 9.4 mg/dL (ref 8.7–10.2)
CHLORIDE: 103 mmol/L (ref 96–106)
CO2: 18 mmol/L — ABNORMAL LOW (ref 20–29)
CREATININE: 1.07 mg/dL (ref 0.76–1.27)
GLOBULIN, TOTAL: 3.3 g/dL (ref 1.5–4.5)
GLUCOSE: 134 mg/dL — ABNORMAL HIGH (ref 70–99)
POTASSIUM: 4.4 mmol/L (ref 3.5–5.2)
SODIUM: 137 mmol/L (ref 134–144)
TOTAL PROTEIN: 7.6 g/dL (ref 6.0–8.5)

## 2024-06-11 LAB — BILIRUBIN, DIRECT: BILIRUBIN DIRECT: 0.2 mg/dL (ref 0.00–0.40)

## 2024-06-11 LAB — CBC W/ DIFFERENTIAL
BANDED NEUTROPHILS ABSOLUTE COUNT: 0 x10E3/uL (ref 0.0–0.1)
BASOPHILS ABSOLUTE COUNT: 0.1 x10E3/uL (ref 0.0–0.2)
BASOPHILS RELATIVE PERCENT: 1 %
EOSINOPHILS ABSOLUTE COUNT: 0.1 x10E3/uL (ref 0.0–0.4)
EOSINOPHILS RELATIVE PERCENT: 2 %
HEMATOCRIT: 42.7 % (ref 37.5–51.0)
HEMOGLOBIN: 14.3 g/dL (ref 13.0–17.7)
IMMATURE GRANULOCYTES: 0 %
LYMPHOCYTES ABSOLUTE COUNT: 1.3 x10E3/uL (ref 0.7–3.1)
LYMPHOCYTES RELATIVE PERCENT: 36 %
MEAN CORPUSCULAR HEMOGLOBIN CONC: 33.5 g/dL (ref 31.5–35.7)
MEAN CORPUSCULAR HEMOGLOBIN: 28.6 pg (ref 26.6–33.0)
MEAN CORPUSCULAR VOLUME: 85 fL (ref 79–97)
MONOCYTES ABSOLUTE COUNT: 0.4 x10E3/uL (ref 0.1–0.9)
MONOCYTES RELATIVE PERCENT: 12 %
NEUTROPHILS ABSOLUTE COUNT: 1.8 x10E3/uL (ref 1.4–7.0)
NEUTROPHILS RELATIVE PERCENT: 49 %
PLATELET COUNT: 234 x10E3/uL (ref 150–450)
RED BLOOD CELL COUNT: 5 x10E6/uL (ref 4.14–5.80)
RED CELL DISTRIBUTION WIDTH: 13.7 % (ref 11.6–15.4)
WHITE BLOOD CELL COUNT: 3.7 x10E3/uL (ref 3.4–10.8)

## 2024-06-11 LAB — PHOSPHORUS: PHOSPHORUS, SERUM: 2.8 mg/dL (ref 2.8–4.1)

## 2024-06-11 LAB — MAGNESIUM: MAGNESIUM: 1.7 mg/dL (ref 1.6–2.3)

## 2024-06-11 LAB — GAMMA GT: GAMMA GLUTAMYL TRANSFERASE: 91 IU/L — ABNORMAL HIGH (ref 0–65)

## 2024-06-12 LAB — TACROLIMUS LEVEL: TACROLIMUS BLOOD: 4.9 ng/mL — ABNORMAL LOW (ref 5.0–20.0)

## 2024-06-25 NOTE — Unmapped (Signed)
 [facility] Specialty and Home Delivery Pharmacy Refill Coordination Note    Specialty Medication(s) to be Shipped:   Transplant: Myfortic  180mg  and Prograf  1mg     Other medication(s) to be shipped: No additional medications requested for fill at this time    Specialty Medications not needed at this time: N/A     Shawn Huerta, DOB: Feb 04, 1966  Phone: [phone] (home)       All above HIPAA information was verified with patient.     Was a Nurse, learning disability used for this call? No    Completed refill call assessment today to schedule patient's medication shipment from the [facility] and Home Delivery Pharmacy  [phone]).  All relevant notes have been reviewed.     Specialty medication(s) and dose(s) confirmed: Regimen is correct and unchanged.   Changes to medications: Kanon reports no changes at this time.  Changes to insurance: No  New side effects reported not previously addressed with a pharmacist or physician: None reported  Questions for the pharmacist: No    Confirmed patient received a Conservation officer, historic buildings and a Surveyor, mining with first shipment. The patient will receive a drug information handout for each medication shipped and additional FDA Medication Guides as required.       DISEASE/MEDICATION-SPECIFIC INFORMATION        N/A    SPECIALTY MEDICATION ADHERENCE     Medication Adherence    Patient reported X missed doses in the last month: 0  Specialty Medication: PROGRAF  1 mg capsule (tacrolimus )  Patient is on additional specialty medications: Yes  Additional Specialty Medications: MYFORTIC  180 mg EC tablet (mycophenolate )  Patient Reported Additional Medication X Missed Doses in the Last Month: 0  Patient is on more than two specialty medications: No              Were doses missed due to medication being on hold? No      MYFORTIC  180 mg EC tablet (mycophenolate ): 7 days of medicine on hand     PROGRAF  1 mg capsule (tacrolimus ): 7 days of medicine on hand       REFERRAL TO PHARMACIST Referral to the pharmacist: Not needed      [facility]     Shipping address confirmed in Epic.     Cost and Payment: Patient has a $0 copay, payment information is not required.    Delivery Scheduled: Yes, Expected medication delivery date: 06/28/2024.     Medication will be delivered via UPS to the prescription address in Epic WAM.    Tom [facility] Specialty and Home Delivery Pharmacy  Specialty Technician

## 2024-06-27 MED FILL — MYFORTIC 180 MG TABLET,DELAYED RELEASE: ORAL | 30 days supply | Qty: 60 | Fill #3

## 2024-06-27 MED FILL — PROGRAF 1 MG CAPSULE: ORAL | 30 days supply | Qty: 480 | Fill #3

## 2024-07-01 DIAGNOSIS — Z944 Liver transplant status: Principal | ICD-10-CM

## 2024-07-01 DIAGNOSIS — Z79899 Other long term (current) drug therapy: Principal | ICD-10-CM

## 2024-07-23 NOTE — Unmapped (Signed)
 [facility] has made a second and final attempt to reach this patient to refill the following medication:PROGRAF  1 mg capsule (tacrolimus ) and MYFORTIC  180 mg EC tablet (mycophenolate ).      We have left voicemails on the following phone numbers: [phone], have sent a MyChart message, and have sent a text message to the following phone numbers: [phone].    Dates contacted: 07/17/2024 and 07/23/2024  Last scheduled delivery: 06/27/2024    The patient may be at risk of non-compliance with this medication. The patient should call the [facility] at [phone]  Option 4, then Option 4: Infectious Disease, Transplant to refill medication.    Rachelle Edwards [facility] Specialty and [place] Day

## 2024-07-29 DIAGNOSIS — Z944 Liver transplant status: Principal | ICD-10-CM

## 2024-07-29 DIAGNOSIS — Z79899 Other long term (current) drug therapy: Principal | ICD-10-CM

## 2024-08-07 NOTE — Unmapped (Signed)
 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     Was a Nurse, learning disability used for this call? No    Completed refill call assessment today to schedule patient's medication shipment from the [facility]  [phone]).  All relevant notes have been reviewed.     Specialty medication(s) and dose(s) confirmed: Regimen is correct and unchanged.   Changes to medications: Layla reports no changes at this time.  Changes to insurance: No  New side effects reported not previously addressed with a pharmacist or physician: None reported  Questions for the pharmacist: No    Confirmed patient received a Conservation officer, historic buildings and a Surveyor, mining with first shipment. The patient will receive a drug information handout for each medication shipped and additional FDA Medication Guides as required.       DISEASE/MEDICATION-SPECIFIC INFORMATION        N/A    SPECIALTY MEDICATION ADHERENCE     Medication Adherence    Patient reported X missed doses in the last month: 0  Specialty Medication: PROGRAF  1 mg capsule (tacrolimus )  Patient is on additional specialty medications: Yes  Additional Specialty Medications: MYFORTIC  180 mg EC tablet (mycophenolate )  Patient Reported Additional Medication X Missed Doses in the Last Month: 0  Patient is on more than two specialty medications: No              Were doses missed due to medication being on hold? No    MYFORTIC  180 mg EC tablet (mycophenolate )  4 days of medicine on hand   PROGRAF  1 mg capsule (tacrolimus )  4 days of medicine on hand       REFERRAL TO PHARMACIST     Referral to the pharmacist: Not needed      [facility]     Shipping address confirmed in Epic.     Cost and Payment: Patient has a copay of $40.00. They are aware and have authorized the pharmacy to charge the credit card on file.    Delivery Scheduled: Yes, Expected medication delivery date: 08/09/2024.     Medication will be delivered via UPS to the prescription address in Epic WAM.    Nelida Winfred [facility]  Specialty Technician

## 2024-08-08 MED FILL — MYFORTIC 180 MG TABLET,DELAYED RELEASE: ORAL | 30 days supply | Qty: 60 | Fill #4

## 2024-08-08 MED FILL — PROGRAF 1 MG CAPSULE: ORAL | 30 days supply | Qty: 480 | Fill #4

## 2024-08-13 NOTE — Unmapped (Signed)
 Spoke with patient by phone regarding annual visit with NP Metheny - secured appt date in May and patient verbalized agreement - he will check MyChart for appt date/time details.

## 2024-08-26 DIAGNOSIS — Z79899 Other long term (current) drug therapy: Principal | ICD-10-CM

## 2024-08-26 DIAGNOSIS — Z944 Liver transplant status: Principal | ICD-10-CM

## 2024-09-06 NOTE — Progress Notes (Signed)
 [facility] has made a second and final attempt to reach this patient to refill the following medication:MYFORTIC  180 mg EC tablet (mycophenolate )PROGRAF  1 mg capsule (tacrolimus ).      We have left voicemails on the following phone numbers: [phone], have sent a MyChart message, and have sent a text message to the following phone numbers: [phone].    Dates contacted: 08/30/24 and 09/06/24  Last scheduled delivery: 10/02/825    The patient may be at risk of non-compliance with this medication. The patient should call the [facility] at [phone]  Option 4, then Option 4: Infectious Disease, Transplant to refill medication.    Shawn Huerta [facility]

## 2024-09-20 NOTE — Progress Notes (Signed)
 [facility] Refill Coordination Note    Specialty Medication(s) to be Shipped:   Transplant: mycophenolic acid 180mg  and Prograf  1mg     Other medication(s) to be shipped: No additional medications requested for fill at this time    Specialty Medications not needed at this time: N/A     Shawn Huerta, DOB: May 26, 1966  Phone: [phone] (home)       All above HIPAA information was verified with patient.     Was a nurse, learning disability used for this call? No    Completed refill call assessment today to schedule patient's medication shipment from the [facility]  [phone]).  All relevant notes have been reviewed.     Specialty medication(s) and dose(s) confirmed: Regimen is correct and unchanged.   Changes to medications: Shawn Huerta reports no changes at this time.  Changes to insurance: No  New side effects reported not previously addressed with a pharmacist or physician: None reported  Questions for the pharmacist: No    Confirmed patient received a Conservation Officer, Historic Buildings and a Surveyor, Mining with first shipment. The patient will receive a drug information handout for each medication shipped and additional FDA Medication Guides as required.       DISEASE/MEDICATION-SPECIFIC INFORMATION        N/A    SPECIALTY MEDICATION ADHERENCE     Medication Adherence    Specialty Medication: mycophenolate : MYFORTIC  180 mg EC tablet  Patient is on additional specialty medications: Yes  Additional Specialty Medications: tacrolimus : PROGRAF  1 mg capsule  Patient Reported Additional Medication X Missed Doses in the Last Month: 0  Patient is on more than two specialty medications: No              Were doses missed due to medication being on hold? No     tacrolimus : PROGRAF  1 mg capsule: 4-5 days of medicine on hand    mycophenolate : MYFORTIC  180 mg EC tablet: 4-5 days of medicine on hand       REFERRAL TO PHARMACIST     Referral to the pharmacist: Not needed      SHIPPING Shipping address confirmed in Epic.     Cost and Payment: Patient has a copay of $40. They are aware and have authorized the pharmacy to charge the credit card on file.    Delivery Scheduled: Yes, Expected medication delivery date: 09/24/2024.     Medication will be delivered via UPS to the prescription address in Epic WAM.    Shawn Huerta   [facility]  Specialty Technician

## 2024-09-23 DIAGNOSIS — Z944 Liver transplant status: Principal | ICD-10-CM

## 2024-09-23 DIAGNOSIS — Z79899 Other long term (current) drug therapy: Principal | ICD-10-CM

## 2024-09-23 MED FILL — MYFORTIC 180 MG TABLET,DELAYED RELEASE: ORAL | 30 days supply | Qty: 60 | Fill #5

## 2024-09-23 MED FILL — PROGRAF 1 MG CAPSULE: ORAL | 30 days supply | Qty: 480 | Fill #5

## 2024-10-18 NOTE — Progress Notes (Signed)
 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     Was a nurse, learning disability used for this call? No    Completed refill call assessment today to schedule patient's medication shipment from the [facility]  [phone]).  All relevant notes have been reviewed.     Specialty medication(s) and dose(s) confirmed: Regimen is correct and unchanged.   Changes to medications: Bo reports no changes at this time.  Changes to insurance: No  New side effects reported not previously addressed with a pharmacist or physician: None reported  Questions for the pharmacist: No    Confirmed patient received a Conservation Officer, Historic Buildings and a Surveyor, Mining with first shipment. The patient will receive a drug information handout for each medication shipped and additional FDA Medication Guides as required.       DISEASE/MEDICATION-SPECIFIC INFORMATION        N/A    SPECIALTY MEDICATION ADHERENCE     Medication Adherence    Patient reported X missed doses in the last month: 0  Specialty Medication: MYFORTIC  180 mg EC tablet (mycophenolate )  Patient is on additional specialty medications: Yes  Additional Specialty Medications: PROGRAF  1 mg capsule (tacrolimus )  Patient Reported Additional Medication X Missed Doses in the Last Month: 0  Patient is on more than two specialty medications: No  Any gaps in refill history greater than 2 weeks in the last 3 months: no  Demonstrates understanding of importance of adherence: yes  Informant: patient  Confirmed plan for next specialty medication refill: delivery by pharmacy  Refills needed for supportive medications: not needed          Refill Coordination    Has the Patients' Contact Information Changed: No  Is the Shipping Address Different: No         Were doses missed due to medication being on hold? No    tacrolimus : PROGRAF  1   mg: 7 days of medicine on hand   mycophenolate : MYFORTIC  180  mg: 7 days of medicine on hand       Specialty medication is an injection or given on a cycle: No    REFERRAL TO PHARMACIST     Referral to the pharmacist: Not needed      [facility]     Shipping address confirmed in Epic.     Cost and Payment: Patient has a copay of $40. They are aware and have authorized the pharmacy to charge the credit card on file.    Delivery Scheduled: Yes, Expected medication delivery date: 10/23/24.     Medication will be delivered via UPS to the prescription address in Epic WAM.    Suzen Blood   [facility]  Specialty Technician

## 2024-10-21 DIAGNOSIS — Z944 Liver transplant status: Principal | ICD-10-CM

## 2024-10-21 DIAGNOSIS — Z79899 Other long term (current) drug therapy: Principal | ICD-10-CM

## 2024-10-22 MED FILL — PROGRAF 1 MG CAPSULE: ORAL | 30 days supply | Qty: 480 | Fill #6

## 2024-10-22 MED FILL — MYFORTIC 180 MG TABLET,DELAYED RELEASE: ORAL | 30 days supply | Qty: 60 | Fill #6

## 2024-11-18 DIAGNOSIS — Z79899 Other long term (current) drug therapy: Principal | ICD-10-CM

## 2024-11-18 DIAGNOSIS — Z944 Liver transplant status: Principal | ICD-10-CM

## 2024-11-21 DIAGNOSIS — Z944 Liver transplant status: Principal | ICD-10-CM

## 2024-11-21 DIAGNOSIS — Z79899 Other long term (current) drug therapy: Principal | ICD-10-CM

## 2024-11-21 MED ORDER — TACROLIMUS 1 MG CAPSULE, IMMEDIATE-RELEASE
ORAL_CAPSULE | Freq: Two times a day (BID) | ORAL | 3 refills | 90.00000 days | Status: CP
Start: 2024-11-21 — End: ?

## 2024-11-21 MED ORDER — MYFORTIC 180 MG TABLET,DELAYED RELEASE
ORAL_TABLET | Freq: Two times a day (BID) | ORAL | 3 refills | 90.00000 days | Status: CP
Start: 2024-11-21 — End: ?

## 2024-11-21 NOTE — Progress Notes (Unsigned)
 Specialty Medication(s): myfortic , prograf     Shawn Huerta has been dis-enrolled from the [facility] specialty pharmacy services as a result of a pharmacy change resulting from insurance limitations. The insurance company requires the patient fill at [facility].    Additional information provided to the patient: team member SF spoke with patient today, he is aware [facility]. Clinic team is also aware and have already sent rxs to new pharmacy    Shawn Huerta, PharmD  [facility] Specialty Pharmacist

## 2024-11-21 NOTE — Progress Notes (Signed)
 Shawn Huerta has been contacted in regards to their refill of mycophenolate : MYFORTIC  180 mg EC tablet and tacrolimus : PROGRAF  1 mg capsule . At this time, they have declined refill due to [facility] must [facility] . Refill assessment call date has been updated per the patient's request.
